# Patient Record
Sex: Male | Born: 1955
Health system: Southern US, Community
[De-identification: ages and names within clinical notes are randomized; demographics above are authoritative.]

## PROBLEM LIST (undated history)

## (undated) ENCOUNTER — Emergency Department (HOSPITAL_COMMUNITY): Admission: EM | Payer: 59 | Source: Home / Self Care

## (undated) DIAGNOSIS — M25559 Pain in unspecified hip: Secondary | ICD-10-CM

## (undated) DIAGNOSIS — S022XXA Fracture of nasal bones, initial encounter for closed fracture: Secondary | ICD-10-CM

## (undated) DIAGNOSIS — I10 Essential (primary) hypertension: Secondary | ICD-10-CM

## (undated) DIAGNOSIS — M545 Low back pain, unspecified: Secondary | ICD-10-CM

## (undated) DIAGNOSIS — F1010 Alcohol abuse, uncomplicated: Secondary | ICD-10-CM

## (undated) DIAGNOSIS — G4730 Sleep apnea, unspecified: Secondary | ICD-10-CM

## (undated) DIAGNOSIS — M722 Plantar fascial fibromatosis: Secondary | ICD-10-CM

## (undated) DIAGNOSIS — R739 Hyperglycemia, unspecified: Secondary | ICD-10-CM

## (undated) DIAGNOSIS — F1910 Other psychoactive substance abuse, uncomplicated: Secondary | ICD-10-CM

## (undated) DIAGNOSIS — Z9989 Dependence on other enabling machines and devices: Secondary | ICD-10-CM

## (undated) DIAGNOSIS — C801 Malignant (primary) neoplasm, unspecified: Secondary | ICD-10-CM

## (undated) DIAGNOSIS — M542 Cervicalgia: Secondary | ICD-10-CM

## (undated) DIAGNOSIS — M1810 Unilateral primary osteoarthritis of first carpometacarpal joint, unspecified hand: Secondary | ICD-10-CM

## (undated) DIAGNOSIS — G4733 Obstructive sleep apnea (adult) (pediatric): Secondary | ICD-10-CM

## (undated) DIAGNOSIS — N419 Inflammatory disease of prostate, unspecified: Secondary | ICD-10-CM

## (undated) DIAGNOSIS — L409 Psoriasis, unspecified: Secondary | ICD-10-CM

## (undated) DIAGNOSIS — E785 Hyperlipidemia, unspecified: Secondary | ICD-10-CM

## (undated) DIAGNOSIS — R079 Chest pain, unspecified: Secondary | ICD-10-CM

## (undated) DIAGNOSIS — F329 Major depressive disorder, single episode, unspecified: Secondary | ICD-10-CM

## (undated) HISTORY — DX: Alcohol abuse, uncomplicated: F10.10

## (undated) HISTORY — DX: Pain in unspecified hip: M25.559

## (undated) HISTORY — DX: Hyperglycemia, unspecified: R73.9

## (undated) HISTORY — DX: Chest pain, unspecified: R07.9

## (undated) HISTORY — PX: PROSTATE BIOPSY: SHX241

## (undated) HISTORY — DX: Cervicalgia: M54.2

## (undated) HISTORY — DX: Other psychoactive substance abuse, uncomplicated: F19.10

## (undated) HISTORY — DX: Unilateral primary osteoarthritis of first carpometacarpal joint, unspecified hand: M18.10

## (undated) HISTORY — DX: Psoriasis, unspecified: L40.9

## (undated) HISTORY — DX: Obstructive sleep apnea (adult) (pediatric): G47.33

## (undated) HISTORY — DX: Inflammatory disease of prostate, unspecified: N41.9

## (undated) HISTORY — DX: Sleep apnea, unspecified: G47.30

## (undated) HISTORY — DX: Plantar fascial fibromatosis: M72.2

## (undated) HISTORY — DX: Hyperlipidemia, unspecified: E78.5

## (undated) HISTORY — DX: Essential (primary) hypertension: I10

## (undated) HISTORY — PX: WISDOM TOOTH EXTRACTION: SHX21

## (undated) HISTORY — DX: Fracture of nasal bones, initial encounter for closed fracture: S02.2XXA

## (undated) HISTORY — DX: Low back pain, unspecified: M54.50

## (undated) HISTORY — DX: Low back pain: M54.5

## (undated) HISTORY — DX: Malignant (primary) neoplasm, unspecified: C80.1

## (undated) HISTORY — DX: Major depressive disorder, single episode, unspecified: F32.9

## (undated) HISTORY — DX: Dependence on other enabling machines and devices: Z99.89

---

## 1976-05-14 HISTORY — PX: NOSE SURGERY: SHX723

## 1978-05-14 DIAGNOSIS — S022XXA Fracture of nasal bones, initial encounter for closed fracture: Secondary | ICD-10-CM

## 1978-05-14 HISTORY — DX: Fracture of nasal bones, initial encounter for closed fracture: S02.2XXA

## 1988-05-14 HISTORY — PX: PROSTATE BIOPSY: SHX241

## 1988-05-14 HISTORY — PX: WISDOM TOOTH EXTRACTION: SHX21

## 2000-05-14 DIAGNOSIS — F1910 Other psychoactive substance abuse, uncomplicated: Secondary | ICD-10-CM

## 2000-05-14 DIAGNOSIS — F1010 Alcohol abuse, uncomplicated: Secondary | ICD-10-CM

## 2000-05-14 HISTORY — DX: Other psychoactive substance abuse, uncomplicated: F19.10

## 2000-05-14 HISTORY — DX: Alcohol abuse, uncomplicated: F10.10

## 2006-12-27 ENCOUNTER — Ambulatory Visit: Payer: Self-pay | Admitting: Gastroenterology

## 2007-01-10 ENCOUNTER — Ambulatory Visit: Payer: Self-pay | Admitting: Gastroenterology

## 2010-01-27 ENCOUNTER — Encounter: Admission: RE | Admit: 2010-01-27 | Discharge: 2010-01-27 | Payer: Self-pay | Admitting: Orthopaedic Surgery

## 2010-06-04 ENCOUNTER — Encounter: Payer: Self-pay | Admitting: Orthopaedic Surgery

## 2012-03-13 ENCOUNTER — Ambulatory Visit (INDEPENDENT_AMBULATORY_CARE_PROVIDER_SITE_OTHER): Payer: BC Managed Care – PPO | Admitting: Emergency Medicine

## 2012-03-13 ENCOUNTER — Ambulatory Visit: Payer: BC Managed Care – PPO

## 2012-03-13 VITALS — BP 138/76 | HR 60 | Temp 97.6°F | Resp 16 | Ht 70.5 in | Wt 212.0 lb

## 2012-03-13 DIAGNOSIS — R0981 Nasal congestion: Secondary | ICD-10-CM

## 2012-03-13 DIAGNOSIS — R05 Cough: Secondary | ICD-10-CM

## 2012-03-13 DIAGNOSIS — J3489 Other specified disorders of nose and nasal sinuses: Secondary | ICD-10-CM

## 2012-03-13 DIAGNOSIS — J40 Bronchitis, not specified as acute or chronic: Secondary | ICD-10-CM

## 2012-03-13 DIAGNOSIS — J45909 Unspecified asthma, uncomplicated: Secondary | ICD-10-CM

## 2012-03-13 LAB — POCT CBC
Granulocyte percent: 65.9 %G (ref 37–80)
MCV: 87.2 fL (ref 80–97)
MID (cbc): 0.5 (ref 0–0.9)
POC LYMPH PERCENT: 27.6 %L (ref 10–50)
Platelet Count, POC: 197 10*3/uL (ref 142–424)
RDW, POC: 15.9 %

## 2012-03-13 LAB — POCT INFLUENZA A/B: Influenza B, POC: NEGATIVE

## 2012-03-13 MED ORDER — PREDNISONE 20 MG PO TABS: ORAL_TABLET | ORAL | Status: DC

## 2012-03-13 MED ORDER — BENZONATATE 200 MG PO CAPS: 200.0000 mg | ORAL_CAPSULE | Freq: Three times a day (TID) | ORAL | Status: DC | PRN

## 2012-03-13 MED ORDER — METHYLPREDNISOLONE ACETATE 80 MG/ML IJ SUSP
40.0000 mg | Freq: Once | INTRAMUSCULAR | Status: AC
  Administered 2012-03-13: 40 mg via INTRAMUSCULAR

## 2012-03-13 MED ORDER — AZITHROMYCIN 250 MG PO TABS: ORAL_TABLET | ORAL | Status: DC

## 2012-03-13 NOTE — Patient Instructions (Signed)
Please recheck in 2-3 days if not feeling better.

## 2012-03-13 NOTE — Progress Notes (Signed)
  Subjective:    Patient ID: Robert Hendrix, male    DOB: [birth year], 56 y.o.   MRN: [id number]  HPI patient enters with 3 day history of head congestion sore throat and generally feeling poorly . He denies having a productive cough. He denies having periodic nasal drainage he has not had any chills with this he has no GI symptoms    Review of Systems patient is a history of a pinched nerve in his neck as well as high cholesterol     Objective:   Physical Exam HEENT exam is unremarkable. Neck is supple. Chest exam reveals occasional rhonchi but no rales and no dullness . Results for orders placed in visit on 03/13/12  POCT CBC      Component Value Range   WBC 8.3  4.6 - 10.2 K/uL   Lymph, poc 2.3  0.6 - 3.4   POC LYMPH PERCENT 27.6  10 - 50 %L   MID (cbc) 0.5  0 - 0.9   POC MID % 6.5  0 - 12 %M   POC Granulocyte 5.5  2 - 6.9   Granulocyte percent 65.9  37 - 80 %G   RBC 5.41  4.69 - 6.13 M/uL   Hemoglobin 14.5  14.1 - 18.1 g/dL   HCT, POC 56.2  13.0 - 53.7 %   MCV 87.2  80 - 97 fL   MCH, POC 26.8 (*) 27 - 31.2 pg   MCHC 30.7 (*) 31.8 - 35.4 g/dL   RDW, POC 86.5     Platelet Count, POC 197  142 - 424 K/uL   MPV 9.2  0 - 99.8 fL  POCT INFLUENZA A/B      Component Value Range   Influenza A, POC Negative     Influenza B, POC Negative      [facility] reading (PRIMARY) by  Dr.Javious Hallisey . Increased basilar markings in both bases consistent with bronchitis       Assessment & Plan:  Patient here with what sounds like an upper respiratory infection with reactive airways disease.

## 2012-06-28 ENCOUNTER — Other Ambulatory Visit: Payer: Self-pay

## 2013-03-19 ENCOUNTER — Other Ambulatory Visit: Payer: Self-pay

## 2015-05-15 DIAGNOSIS — R079 Chest pain, unspecified: Secondary | ICD-10-CM

## 2015-05-15 HISTORY — DX: Chest pain, unspecified: R07.9

## 2015-11-10 ENCOUNTER — Ambulatory Visit: Payer: BLUE CROSS/BLUE SHIELD | Attending: Internal Medicine | Admitting: Physical Therapy

## 2015-11-14 ENCOUNTER — Ambulatory Visit: Payer: BLUE CROSS/BLUE SHIELD | Admitting: Physical Therapy

## 2016-05-14 DIAGNOSIS — I8290 Acute embolism and thrombosis of unspecified vein: Secondary | ICD-10-CM

## 2016-05-14 HISTORY — DX: Acute embolism and thrombosis of unspecified vein: I82.90

## 2016-05-14 HISTORY — PX: COLONOSCOPY: SHX174

## 2016-05-14 HISTORY — PX: POLYPECTOMY: SHX149

## 2016-08-16 DIAGNOSIS — M542 Cervicalgia: Secondary | ICD-10-CM | POA: Diagnosis not present

## 2016-08-16 DIAGNOSIS — M545 Low back pain: Secondary | ICD-10-CM | POA: Diagnosis not present

## 2016-08-16 DIAGNOSIS — G8929 Other chronic pain: Secondary | ICD-10-CM | POA: Diagnosis not present

## 2016-08-17 DIAGNOSIS — M50123 Cervical disc disorder at C6-C7 level with radiculopathy: Secondary | ICD-10-CM | POA: Diagnosis not present

## 2016-08-22 DIAGNOSIS — M50123 Cervical disc disorder at C6-C7 level with radiculopathy: Secondary | ICD-10-CM | POA: Diagnosis not present

## 2016-08-24 DIAGNOSIS — M50123 Cervical disc disorder at C6-C7 level with radiculopathy: Secondary | ICD-10-CM | POA: Diagnosis not present

## 2016-10-05 ENCOUNTER — Encounter: Payer: Self-pay | Admitting: Neurology

## 2016-10-05 ENCOUNTER — Ambulatory Visit (INDEPENDENT_AMBULATORY_CARE_PROVIDER_SITE_OTHER): Payer: 59 | Admitting: Neurology

## 2016-10-05 VITALS — BP 122/72 | HR 74 | Resp 12 | Ht 72.0 in | Wt 230.0 lb

## 2016-10-05 DIAGNOSIS — M545 Low back pain: Secondary | ICD-10-CM | POA: Diagnosis not present

## 2016-10-05 DIAGNOSIS — Z9989 Dependence on other enabling machines and devices: Secondary | ICD-10-CM | POA: Diagnosis not present

## 2016-10-05 DIAGNOSIS — G4733 Obstructive sleep apnea (adult) (pediatric): Secondary | ICD-10-CM | POA: Diagnosis not present

## 2016-10-05 DIAGNOSIS — M50123 Cervical disc disorder at C6-C7 level with radiculopathy: Secondary | ICD-10-CM | POA: Diagnosis not present

## 2016-10-05 DIAGNOSIS — M50122 Cervical disc disorder at C5-C6 level with radiculopathy: Secondary | ICD-10-CM | POA: Diagnosis not present

## 2016-10-05 NOTE — Progress Notes (Signed)
SLEEP MEDICINE CLINIC   Provider:  [name], M D  Primary Care Physician:  Robert Baton, MD   Referring Provider: Shon Baton, MD    Chief Complaint  Patient presents with  . New Evaluation    Rm 11, alone. Sleep consult.    HPI:  Robert Hendrix is a 61 y.o. male , seen here as in a referral/ revisit  from Robert Hendrix for A reevaluation of sleep apnea.  Robert Hendrix was diagnosed with obstructive sleep apnea and placed on a CPAP machine probably around the year 2010. This weight loss he was able to needing less pressures on CPAP and remained on a setting of 10 cm water pressure with 2 cm EPR. Robert Hendrix got remarried last year and his spouse now reports that he still seems to 10 times breathe irregularly at night, but there has been no report of breakthrough snoring. He also does not feel that the quality of his sleep has been different he is not less restored or refreshed in the mornings. He brought me a compliance report today he is 100% compliant with her average user time of 7 hours and 31 minutes, CPAP is set at 10 cm water pressure with 2 cm EPR and the residual AHI is 0.2. The patient had noticed some weight gain probably about 10 pounds over the last year, but he remains physically very active, plays tennis.He is by no means deconditioned and he hasn't changed his medications or lifestyle habits.  Chief complaint according to patient : " My wife noted my irregular breathing "  Sleep habits are as follows: Bedtime is usually between 10:30 and 11 PM, and there is no latency to sleep. He sleeps on his side, he uses a temper. In bed with a very mild raise. Only one pillow. The bedroom is described as cool, quiet and dark. He will usually have one bathroom break at night otherwise sleeps through. He rises in the morning at 6:30 AM. He gets about 7 hours of nocturnal sleep. He reports only hip pain sometimes interfering with his sleep quality, but he does not wake up with  palpitations, diaphoresis, dizziness or headaches.  Occasionally he will take naps in daytime on weekends.  Sleep medical history and family sleep history:  No family history of OSA.  Patient of Robert Hendrix, who  had PVC, normal Echo -  Ef 555, stress test revealed PVCs.  He has been evaluated for hyperglycemia but his hemoglobin A1c is only 5.3, he wore her cardiac monitor in 2017 which revealed only PVCs. He had some occipital muscle tension neck pain. He is a cigar smoker. He is a highly compliant CPAP patient. Medications : currently on Crestor, no longer on simvastatin.  Social history:  Remarried, 3 children. Youngest is 17.  Cigar smoker 1 a day. ETOH none, caffeine : a lot- coffee in AM 2 , soda in PM, no iced tea.    Review of Systems: Out of a complete 14 system review, the patient complains of only the following symptoms, and all other reviewed systems are negative. Epworth score 8 , Fatigue severity score  13  , depression score 1/ 15    Social History   Social History  . Marital status: Married    Spouse name: Robert Hendrix  . Number of children: 3  . Years of education: DMD   Occupational History  . Not on file.   Social History Main Topics  . Smoking status: Light Tobacco Smoker  Types: Cigars  . Smokeless tobacco: Never Used  . Alcohol use No  . Drug use: No  . Sexual activity: Not on file   Other Topics Concern  . Not on file   Social History Narrative   Lives with wife, Robert Hendrix   Caffeine use: 6-8 cups per day   Right handed     Family History  Problem Relation Age of Onset  . Adopted: Yes    Past Medical History:  Diagnosis Date  . Alcohol abuse   . Chest pain   . Drug abuse   . Hyperglycemia   . Hyperlipidemia   . Hypertension   . Low back pain   . Major depression   . Nasal fracture   . Neck pain   . OSA on CPAP   . Osteoarthritis of thumb   . Plantar fasciitis   . Prostatitis   . Psoriasis     Past Surgical History:  Procedure Laterality  Date  . NOSE SURGERY  1978   Nasal fracture   . PROSTATE BIOPSY      Current Outpatient Prescriptions  Medication Sig Dispense Refill  . Multiple Vitamins-Minerals (MULTIVITAMIN ADULTS PO) Take 1 tablet by mouth daily.    . rosuvastatin (CRESTOR) 10 MG tablet Take 10 mg by mouth daily.    Marland Kitchen venlafaxine XR (EFFEXOR-XR) 150 MG 24 hr capsule Take 150 mg by mouth daily with breakfast.     No current facility-administered medications for this visit.     Allergies as of 10/05/2016  . (No Known Allergies)    Vitals: BP 122/72 (BP Location: Right Arm, Patient Position: Sitting, Cuff Size: Normal)   Pulse 74   Resp 12   Ht 6' (1.829 m)   Wt 230 lb (104.3 kg)   SpO2 97%   BMI 31.19 kg/m  Last Weight:  Wt Readings from Last 1 Encounters:  10/05/16 230 lb (104.3 kg)   EVO:JJKK mass index is 31.19 kg/m.     Last Height:   Ht Readings from Last 1 Encounters:  10/05/16 6' (1.829 m)    Physical exam:  General: The patient is awake, alert and appears not in acute distress. The patient is well groomed. Head: Normocephalic, atraumatic. Neck is supple. Mallampati 1,  neck circumference:17. Nasal airflow patent , TMJ click not evident . Retrognathia is not seen.  Bruxism marks- used bite guard.  Cardiovascular:  Regular rate and rhythm, without  murmurs or carotid bruit, and without distended neck veins. Respiratory: Lungs are clear to auscultation. Skin:  Without evidence of edema, or rash Trunk:   Neurologic exam : The patient is awake and alert, oriented to place and time.    Cranial nerves: Pupils are equal and briskly reactive to light.  Extraocular movements  in vertical and horizontal planes intact and without nystagmus. Visual fields by finger perimetry are intact. Hearing to finger rub intact. Facial sensation intact to fine touch.  Facial motor strength is symmetric and tongue and uvula move midline. Shoulder shrug was symmetrical.   Motor exam:   Normal tone, muscle bulk  and symmetric strength in all extremities.     Assessment:  After physical and neurologic examination, review of laboratory studies,  Personal review of imaging studies, reports of other /same  Imaging studies, results of polysomnography and / or neurophysiology testing and pre-existing records as far as provided in visit., my assessment is   1) I have discussed with Robert Hendrix that his download CPAP report reveals no residual  apnea of significance. This could mean that his apnea is perfectly treated it could also mean that his apnea is no longer present. There is certainly no need to adjust CPAP at this point. 2 make sure that there is a baseline apnea but deserves treatment with CPAP, I will order a home sleep test for him that he can use for either one or 2 nights while not using CPAP. The results should help [place] to estimate the best treatment options at this time and if treatment is necessary at all.  The patient was advised of the nature of the diagnosed disorder , the treatment options and the  risks for general health and wellness arising from not treating the condition.   I spent more than 30  minutes of face to face time with the patient.  Greater than 50% of time was spent in counseling and coordination of care. We have discussed the diagnosis and differential and I answered the patient's questions.    Plan:  Treatment plan and additional workup :   HST and follow up as necessary. Will leave on CPAP for now.    [name], MD 8/54/6270, 35:00 AM  Certified in Neurology by ABPN Certified in [place] by [facility] [address] [place], [place] [address]

## 2016-10-22 DIAGNOSIS — G8929 Other chronic pain: Secondary | ICD-10-CM | POA: Diagnosis not present

## 2016-10-22 DIAGNOSIS — M545 Low back pain: Secondary | ICD-10-CM | POA: Diagnosis not present

## 2016-11-01 ENCOUNTER — Encounter: Payer: Self-pay | Admitting: Gastroenterology

## 2016-11-02 DIAGNOSIS — Z0000 Encounter for general adult medical examination without abnormal findings: Secondary | ICD-10-CM | POA: Diagnosis not present

## 2016-11-09 ENCOUNTER — Ambulatory Visit (INDEPENDENT_AMBULATORY_CARE_PROVIDER_SITE_OTHER): Payer: 59 | Admitting: Neurology

## 2016-11-09 DIAGNOSIS — R7309 Other abnormal glucose: Secondary | ICD-10-CM | POA: Diagnosis not present

## 2016-11-09 DIAGNOSIS — Z9989 Dependence on other enabling machines and devices: Principal | ICD-10-CM

## 2016-11-09 DIAGNOSIS — Z125 Encounter for screening for malignant neoplasm of prostate: Secondary | ICD-10-CM | POA: Diagnosis not present

## 2016-11-09 DIAGNOSIS — Z0000 Encounter for general adult medical examination without abnormal findings: Secondary | ICD-10-CM | POA: Diagnosis not present

## 2016-11-09 DIAGNOSIS — M25559 Pain in unspecified hip: Secondary | ICD-10-CM | POA: Diagnosis not present

## 2016-11-09 DIAGNOSIS — G4733 Obstructive sleep apnea (adult) (pediatric): Secondary | ICD-10-CM

## 2016-11-09 DIAGNOSIS — M2550 Pain in unspecified joint: Secondary | ICD-10-CM | POA: Diagnosis not present

## 2016-11-09 DIAGNOSIS — Z1389 Encounter for screening for other disorder: Secondary | ICD-10-CM | POA: Diagnosis not present

## 2016-11-12 DIAGNOSIS — Z1212 Encounter for screening for malignant neoplasm of rectum: Secondary | ICD-10-CM | POA: Diagnosis not present

## 2016-11-19 NOTE — Procedures (Signed)
[facility]  [facility] [address] 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  [name] was diagnosed with obstructive sleep apnea and placed on a CPAP machine probably around the year 2010. With weight loss he was able to need less pressures on CPAP but remained on a setting of 10 cm water pressure with 2 cm EPR. [name] spouse now reports that he still seems to breathe irregularly at night, but there has been no report of breakthrough snoring. He also does not feel that the quality of his sleep has been different -he is not less restored or refreshed in the mornings.  He brought me a compliance report today, which showed that he is 100% compliant with CPAP, at an average user time of 7 hours and 31 minutes-CPAP is set at 10 cm water pressure with 2 cm EPR and the residual AHI is 0.2. Marland Kitchen Epworth score 8 points , Fatigue severity score  13  , depression score 1/ 15.   STUDY RESULTS: Total Recording Time: 7h 70min. Total Apnea/Hypopnea Index (AHI): 8.2/hr. RDI 9.4.Average Oxygen Saturation: 90%. Lowest Oxygen Saturation: SpO2 at  81%, desaturation duration was high - 87 minutes at or below 88% SpO2. Average Heart Rate: 61 bpm, between 46 and 88 bpm. IMPRESSION: Mild OSA , snoring ( reflected in RDI ) but prolonged hypoxemia is present.  RECOMMENDATION: Due to associated low oxygen level, I would still recommend CPAP use. Without this finding he could have used a dental device for snoring and mild apnea correction. I will prescribe an auto-pap CPAP at 5-12 cm water pressure with 2 cm EPR. I recommend an ONO on CPAP to evaluate for oxygen need.  I certify that I have reviewed the raw data recording prior to the issuance of this report  in accordance with the standards of the American Academy of Sleep Medicine (AASM).  [name], MD  11-12-2016  [facility] [place], [place], AASM

## 2016-11-19 NOTE — Addendum Note (Signed)
Addended by: [name] on: 11/19/2016 06:02 PM   Modules accepted: Orders

## 2016-11-20 ENCOUNTER — Encounter: Payer: Self-pay | Admitting: Physical Therapy

## 2016-11-20 ENCOUNTER — Ambulatory Visit: Payer: 59 | Attending: Internal Medicine | Admitting: Physical Therapy

## 2016-11-20 ENCOUNTER — Telehealth: Payer: Self-pay | Admitting: Neurology

## 2016-11-20 DIAGNOSIS — R293 Abnormal posture: Secondary | ICD-10-CM | POA: Insufficient documentation

## 2016-11-20 DIAGNOSIS — M25651 Stiffness of right hip, not elsewhere classified: Secondary | ICD-10-CM | POA: Insufficient documentation

## 2016-11-20 DIAGNOSIS — M25551 Pain in right hip: Secondary | ICD-10-CM | POA: Diagnosis present

## 2016-11-20 DIAGNOSIS — M6281 Muscle weakness (generalized): Secondary | ICD-10-CM | POA: Insufficient documentation

## 2016-11-20 NOTE — Telephone Encounter (Signed)
Called pt to discuss sleep study results. No answer at this time. LVM for pt to call back 

## 2016-11-20 NOTE — Telephone Encounter (Signed)
-----   Message from [name], MD sent at 11/19/2016  6:02 PM EDT ----- Due to associated low oxygen level, I would still  recommend CPAP use. Without this finding he could have used a  dental device for snoring and mild apnea correction. I will  prescribe an auto-pap CPAP at 5-12 cm water pressure with 2 cm  EPR. I recommend an ONO on CPAP to evaluate for oxygen need.   [name], MD

## 2016-11-20 NOTE — Therapy (Signed)
[place], [place], [address] Phone: [phone]   Fax:  [phone]  Physical Therapy Evaluation  Patient Details  Name: Robert Hendrix MRN: [id number] Date of Birth: July 12, 1955 Referring Provider: Shon Baton MD  Encounter Date: 11/20/2016      PT End of Session - 11/20/16 1300    Visit Number 1   Number of Visits 12   Date for PT Re-Evaluation 01/01/17   Authorization Type [employer]   Authorization Time Period 01-01-17   PT Start Time 1146   PT Stop Time 1300   PT Time Calculation (min) 74 min   Activity Tolerance Patient tolerated treatment well   Behavior During Therapy [facility] for tasks assessed/performed      Past Medical History:  Diagnosis Date  . Alcohol abuse   . Chest pain   . Drug abuse   . Hyperglycemia   . Hyperlipidemia   . Hypertension   . Low back pain   . Major depression   . Nasal fracture   . Neck pain   . OSA on CPAP   . Osteoarthritis of thumb   . Plantar fasciitis   . Prostatitis   . Psoriasis     Past Surgical History:  Procedure Laterality Date  . NOSE SURGERY  1978   Nasal fracture   . PROSTATE BIOPSY      There were no vitals filed for this visit.       Subjective Assessment - 11/20/16 1155    Subjective I have had hip pain for about 2 or 3 months    Limitations Other (comment);Standing  Playing tennis   How long can you sit comfortably? unlimited   relieves pain   How long can you stand comfortably? unlimited   How long can you walk comfortably? --  playing tennis  and in the mornings when waking   Diagnostic tests x ray  stenosis   Patient Stated Goals Playing more than one set of tennis without exacerbating pain,    Currently in Pain? Yes   Pain Score 3    Pain Location Hip   Pain Orientation Right;Left  right > Left   Pain Descriptors / Indicators Tightness;Aching   Pain Type Chronic pain   Pain Onset More than a month ago   Pain Frequency  Intermittent   Aggravating Factors  playing tennis, leg lifts , bil leg lifts    Pain Relieving Factors medication, ice after activity            [facility] PT Assessment - 11/20/16 1200      Assessment   Medical Diagnosis bil hip pain   right more severe   Referring Provider Shon Baton MD   Onset Date/Surgical Date 09/20/16  approximately   Hand Dominance Right   Next MD Visit  a year for annual   Prior Therapy saw [facility] for  radiating numbness  L ue   one in June for hip     Precautions   Precautions None     Restrictions   Weight Bearing Restrictions No     Balance Screen   Has the patient fallen in the past 6 months No   Has the patient had a decrease in activity level because of a fear of falling?  No   Is the patient reluctant to leave their home because of a fear of falling?  No     Home Ecologist residence  Home Access Stairs to enter   Entrance Stairs-Number of Steps 3   Entrance Stairs-Rails None   Home Layout One level     Prior Function   Level of Independence Independent   Vocation Full time employment   Vocation Requirements oral surgeon  flexed posture      Cognition   Overall Cognitive Status Within Functional Limits for tasks assessed     Observation/Other Assessments   Focus on Therapeutic Outcomes (FOTO)  FOTO intake 98% limitation 2% predicted  11% (clinical judgement ) 0%      Functional Tests   Functional tests Squat     Squat   Comments minimally wt bears to right. in deep squat     Single Leg Squat   Comments Pt compensates with forward lean and internally rotating on right side     Posture/Postural Control   Posture/Postural Control Postural limitations   Postural Limitations Anterior pelvic tilt   Posture Comments right anterior rotation elevated QL on right shearing force on right.      ROM / Strength   AROM / PROM / Strength AROM;Strength     AROM   Overall AROM  Deficits   Right Hip  External Rotation  45   Right Hip Internal Rotation  20   Left Hip External Rotation  45   Left Hip Internal Rotation  36     Strength   Overall Strength Deficits   Right Hip Flexion 5/5   Right Hip Extension 4+/5   Right Hip ABduction 4/5   Left Hip Flexion 5/5   Left Hip Extension 5/5   Left Hip ABduction 4+/5   Right Knee Flexion 5/5   Right Knee Extension 5/5   Left Knee Flexion 5/5   Left Knee Extension 5/5     Flexibility   Hamstrings Pt with right hamstring tighter than right  right 55,    left 67   Quadriceps Pt with prone quadriceps right with + ELYs greater than left      Palpation   Palpation comment tenderness and tightness over right pirifomis.      Thomas Test    Findings Positive   Comments bil right tighter than left     Ober's Test   Findings Positive   Comments right tighter than left      Hip Scouring   Findings Negative   Comments bil            Objective measurements completed on examination: See above findings.          [facility] Adult PT Treatment/Exercise - 11/20/16 1308      Posture/Postural Control   Posture/Postural Control Postural limitations   Postural Limitations Anterior pelvic tilt   Posture Comments right anterior rotation elevated QL on right shearing force on right.      Self-Care   Self-Care Posture;Other Self-Care Comments   Posture initial sitting and standing posture   Other Self-Care Comments  educated on TDN aftercare and precautians     Lumbar Exercises: Stretches   Passive Hamstring Stretch 30 seconds  sitting.  both VC and TC   Passive Hamstring Stretch Limitations with stretch out strap right side x 3 so sec   Quadruped Mid Back Stretch 30 seconds;4 reps   Quadruped Mid Back Stretch Limitations forward x 2 and to left for right quadratus lumborum stretch   Piriformis Stretch 30 seconds;3 reps  supine   Piriformis Stretch Limitations sitting 30 x sec x 2  Modalities   Modalities Moist Heat      Moist Heat Therapy   Number Minutes Moist Heat 15 Minutes   Moist Heat Location Lumbar Spine;Hip  right hip and right quadratus lumborum     Manual Therapy   Manual Therapy Soft tissue mobilization;Myofascial release   Manual therapy comments trigger point for piriformis and QL   Soft tissue mobilization right piriformis with right hip IR/ER   Myofascial Release right quadratus lumborum          Trigger Point Dry Needling - 11/20/16 1314    Consent Given? Yes   Education Handout Provided Yes   Muscles Treated Upper Body Quadratus Lumborum  right side   Muscles Treated Lower Body Piriformis  right side   Piriformis Response Twitch response elicited;Palpable increased muscle length              PT Education - 11/20/16 1240    Education provided Yes   Education Details POC. Explanation of findings.  intial posture,TDN aftercare and precautians, hamstring stretch, Piriformis and childs pose stretch   Person(s) Educated Patient   Methods Explanation;Demonstration;Tactile cues;Verbal cues;Handout   Comprehension Verbalized understanding;Returned demonstration          PT Short Term Goals - 11/20/16 1300      PT SHORT TERM GOAL #1   Title STG =LTG           PT Long Term Goals - 11/20/16 1300      PT LONG TERM GOAL #1   Title "Pt will be independent with advanced HEP.    Baseline Not knowledgeable about specific exercises for condition.   Time 6   Period Weeks   Status New     PT LONG TERM GOAL #2   Title Pt will be able to play tennis match without exacerbation of pain in right hip   Baseline Pt cannot play one set without pain   Time 6   Period Weeks   Status New     PT LONG TERM GOAL #3   Title Pt will demonstrate symetrical flexiblity of bil hip. for increase comfort upon waking in the morning.  Hip IR right atleast 30 degrees Actively without exacerbating pain   Baseline Pt with positive OBER and thomas test bil bit worse on right   Time 6    Period Weeks   Status New     PT LONG TERM GOAL #4   Title "Demonstrate understanding of proper sitting posture and be more conscious of position and posture throughout the day in order to not deactivate gluteals in standing.   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  Pt stands a good bit of time in his job and can stand unlimted.  Pt notices pain when arising from bed in the morning and with athletic pursuits and loading of hip.  Pt complains of at most 3/10 pain in right hip and hs tightness of pirifomis and elevated quadratus lumborum on right with slight anterior roation  with anterior lean of body in stance.  Pt performs squat with wt shipt to left but able to deep squat just past 90 degrees. Pt has decreased IR of right hip compare to right.  right 20 degrees and left 36 IR. Pt with decreased flexibility and muscle weakness in right hip . SEe flowchart.  Pt would benefit from skilled PT including dry needling for 2 x a week for up to 6 weeks to alleviate symptoms and return to leisure acitivites unlimited by pain.     Clinical Presentation Stable   Clinical Decision Making Low   Rehab Potential Excellent   PT Frequency 2x / week   PT Duration 6 weeks   PT Treatment/Interventions Cryotherapy;Electrical  Stimulation;Iontophoresis 4mg /ml Dexamethasone;Moist Heat;Ultrasound;Traction;Functional mobility training;Therapeutic activities;Therapeutic exercise;Neuromuscular re-education;Patient/family education;Passive range of motion;Manual techniques;Dry needling;Taping   PT Next Visit Plan REview exercises   assess TDN    PT Home Exercise Plan hamstring stretch, piriformis and childs pose   Consulted and Agree with Plan of Care Patient      Patient will benefit from skilled therapeutic intervention in order to improve the following deficits and impairments:  Pain, Postural dysfunction, Decreased mobility, Decreased strength, Hypomobility, Increased fascial restricitons, Impaired flexibility, Decreased activity tolerance (unable to play sports /tennis without pain)  Visit Diagnosis: Pain in right hip  Stiffness of right hip, not elsewhere classified  Muscle weakness (generalized)  Abnormal posture     Problem List There are no active problems to display for this patient.   Voncille Lo, PT Certified Exercise Expert for the Aging Adult  11/20/16 1:49 PM Phone: [phone] Fax: [facility] [address] [place], [place], [address] Phone: [phone]   Fax:  [phone]  Name: Sharron Simpson MRN: [id number] Date of Birth: April 23, 1956

## 2016-11-20 NOTE — Patient Instructions (Addendum)
Get a stretch out strap on [employer]   Posture Tips DO: - stand tall and erect - keep chin tucked in - keep head and shoulders in alignment - check posture regularly in mirror or large window - pull head back against headrest in car seat;  Change your position often.  Sit with lumbar support. DON'T: - slouch or slump while watching TV or reading - sit, stand or lie in one position  for too long;  Sitting is especially hard on the spine so if you sit at a desk/use the computer, then stand up often!   Copyright  VHI. All rights reserved.  Posture - Standing   Good posture is important. Avoid slouching and forward head thrust. Maintain curve in low back and align ears over shoul- ders, hips over ankles.  Pull your belly button in toward your back bone. Even wt in heels and toes, belly button in,  Ribs lifted up ( golden thread to the sky) chin down. Not military and Not titanic position  Copyright  VHI. All rights reserved.  Posture - Sitting   Sit upright, head facing forward. Try using a roll to support lower back. Keep shoulders relaxed, and avoid rounded back. Keep hips level with knees. Avoid crossing legs for long periods.  Sit all the way back in your chair and sit on sit bone not tail bone.   Copyright  VHI. All rights reserved.   Leg Extension (Hamstring)   Sit toward front edge of chair, with leg out straight, heel on floor, toes pointing toward body. Keeping back straight, bend forward at hip, breathing out through pursed lips. Return, breathing in. Repeat _2-3__ times. Hold for 30 seconds Repeat with other leg. Do __2_ sessions per day. Variation: Perform from standing position, with support.      Hamstring Step 3   Left leg in maximal straight leg raise, heel at maximal stretch, straighten knee further by tightening knee cap. Warning: Intense stretch. Stay within tolerance. Hold _30__ seconds. Relax knee cap only.   Hamstring is stetch  Great toe same side  over same  side shoulder ,  IT band is stretch over great toe over opposite shoulder Repeat _2-3 __ times.     Hip Stretch  Put right ankle over left knee. Let right knee fall downward, but keep ankle in place. Feel the stretch in hip. May push down gently with hand to feel stretch. Hold ____ seconds while counting out loud. Repeat with other leg. Repeat _2-3___ times. Do __2-3__ sessions per day.     Stretching: Piriformis (Supine)  Pull right knee toward opposite shoulder. Hold 30____ seconds. Relax. Repeat _2-3___ times per set. Do ___1_ sets per session. Do ___2_ sessions per day.  D Keeping back flat and feet together, rotate knees to left side. Hold ___30_ seconds. Repeat __3_ times per set. . Do __2__ sessions per day.  http://orth.exer.us/122   Copyright  VHI. All rights reserved.    Hamstring Step 2   Left foot relaxed, knee straight, other leg bent, foot flat. Raise straight leg further upward to maximal range. Hold _30__ seconds. Relax leg completely down. Repeat ___ times.  Copyright  VHI. All rights reserved.  Side Twist, Kneeling   Kneel, buttocks on heels. Fold upper body forward from hips. Then reach to each side as far as possible, keeping chest low toward floor. Hold each position _30__ seconds. Repeat __3_ times per session. Do __2_ sessions per day. Lean hands to the left and perform  to stretch right quadratus lumborum  Copyright  VHI. All rights reserved.     Trigger Point Dry Needling  . What is Trigger Point Dry Needling (DN)? o DN is a physical therapy technique used to treat muscle pain and dysfunction. Specifically, DN helps deactivate muscle trigger points (muscle knots).  o A thin filiform needle is used to penetrate the skin and stimulate the underlying trigger point. The goal is for a local twitch response (LTR) to occur and for the trigger point to relax. No medication of any kind is injected during the procedure.   . What Does Trigger Point  Dry Needling Feel Like?  o The procedure feels different for each individual patient. Some patients report that they do not actually feel the needle enter the skin and overall the process is not painful. Very mild bleeding may occur. However, many patients feel a deep cramping in the muscle in which the needle was inserted. This is the local twitch response.   Marland Kitchen How Will I feel after the treatment? o Soreness is normal, and the onset of soreness may not occur for a few hours. Typically this soreness does not last longer than two days.  o Bruising is uncommon, however; ice can be used to decrease any possible bruising.  o In rare cases feeling tired or nauseous after the treatment is normal. In addition, your symptoms may get worse before they get better, this period will typically not last longer than 24 hours.   . What Can I do After My Treatment? o Increase your hydration by drinking more water for the next 24 hours. o You may place ice or heat on the areas treated that have become sore, however, do not use heat on inflamed or bruised areas. Heat often brings more relief post needling. o You can continue your regular activities, but vigorous activity is not recommended initially after the treatment for 24 hours. o DN is best combined with other physical therapy such as strengthening, stretching, and other therapies.   Robert Hendrix, PT Certified Exercise Expert for the Aging Adult  11/20/16 12:47 PM Phone: [phone] Fax: [phone]

## 2016-11-22 ENCOUNTER — Encounter: Payer: Self-pay | Admitting: Gastroenterology

## 2016-11-26 ENCOUNTER — Telehealth: Payer: Self-pay | Admitting: Neurology

## 2016-11-26 DIAGNOSIS — M545 Low back pain: Secondary | ICD-10-CM | POA: Diagnosis not present

## 2016-11-26 DIAGNOSIS — M50123 Cervical disc disorder at C6-C7 level with radiculopathy: Secondary | ICD-10-CM | POA: Diagnosis not present

## 2016-11-26 DIAGNOSIS — G8929 Other chronic pain: Secondary | ICD-10-CM | POA: Diagnosis not present

## 2016-11-26 NOTE — Telephone Encounter (Signed)
Discussed sleep study results with the pt. Pt stated he has been on CPAP and expressed that [facility] was who he gets his supplies from. I told him that Dr Brett Fairy made some changes to his cpap and would like for him to have a ONO done on CPAP to determine if he needs oxygen added. I will send a message to [facility] and see if they can get it changed for the pt. Pt verbalized understanding

## 2016-11-27 ENCOUNTER — Encounter: Payer: Self-pay | Admitting: Physical Therapy

## 2016-11-27 ENCOUNTER — Ambulatory Visit: Payer: 59 | Admitting: Physical Therapy

## 2016-11-27 DIAGNOSIS — M25651 Stiffness of right hip, not elsewhere classified: Secondary | ICD-10-CM

## 2016-11-27 DIAGNOSIS — M25551 Pain in right hip: Secondary | ICD-10-CM | POA: Diagnosis not present

## 2016-11-27 DIAGNOSIS — M6281 Muscle weakness (generalized): Secondary | ICD-10-CM

## 2016-11-27 DIAGNOSIS — R293 Abnormal posture: Secondary | ICD-10-CM

## 2016-11-27 NOTE — Therapy (Signed)
[place], [place], [address] Phone: [phone]   Fax:  [phone]  Physical Therapy Treatment  Patient Details  Name: Robert Hendrix MRN: [id number] Date of Birth: 02-Oct-1955 Referring Provider: Shon Baton MD  Encounter Date: 11/27/2016      PT End of Session - 11/27/16 1153    Visit Number 2   Number of Visits 12   Date for PT Re-Evaluation 01/01/17   Authorization Type BCBS   Authorization Time Period 01-01-17   PT Start Time 1106   PT Stop Time 1200   PT Time Calculation (min) 54 min   Activity Tolerance Patient tolerated treatment well   Behavior During Therapy [employer] for tasks assessed/performed      Past Medical History:  Diagnosis Date  . Alcohol abuse   . Chest pain   . Drug abuse   . Hyperglycemia   . Hyperlipidemia   . Hypertension   . Low back pain   . Major depression   . Nasal fracture   . Neck pain   . OSA on CPAP   . Osteoarthritis of thumb   . Plantar fasciitis   . Prostatitis   . Psoriasis     Past Surgical History:  Procedure Laterality Date  . NOSE SURGERY  1978   Nasal fracture   . PROSTATE BIOPSY      There were no vitals filed for this visit.      Subjective Assessment - 11/27/16 1110    Subjective I did weights on my legs for the first time in a couple of months.  [place]  Sore for about 48 hours.  Epidural injection coming up on July 19th.   Limitations Other (comment);Standing   Diagnostic tests x ray  stenosis   Patient Stated Goals Playing more than one set of tennis without exacerbating pain,    Currently in Pain? Yes   Pain Orientation Right;Left  right is worse than left   Pain Descriptors / Indicators Tightness   Pain Type Chronic pain   Pain Onset More than a month ago                         [facility] Adult PT Treatment/Exercise - 11/27/16 1150      Lumbar Exercises: Stretches   Pelvic Tilt 10 seconds   Pelvic Tilt Limitations with VC for abdominal bracing     Lumbar Exercises: Supine   Ab Set 10 reps   AB Set Limitations 10 sec with VC for abdominal bracing, tested in sidelying as well for better kinesthetic awareness   Clam 10 reps   Clam Limitations VC for abdominal bracingbil   Heel Slides 10 reps   Heel Slides Limitations VC for abdominal bracing   Bent Knee Raise 10 reps   Bent Knee Raise Limitations  VC for abdominal bracing     Lumbar Exercises: Prone   Other Prone Lumbar Exercises attempted prone plank < 20 seconds with poor motor control after 20 seconds     Modalities   Modalities Moist Heat     Moist Heat Therapy   Number Minutes Moist Heat 15 Minutes   Moist Heat Location Lumbar Spine  right hip and right quadratus lumborum     Manual Therapy   Manual Therapy Soft tissue mobilization;Myofascial release   Manual therapy comments trigger point for piriformis and QL   Soft tissue mobilization right piriformis with right hip IR/ER  Myofascial Release right quadratus lumborum                  PT Short Term Goals - 11/20/16 1300      PT SHORT TERM GOAL #1   Title STG =LTG           PT Long Term Goals - 11/20/16 1300      PT LONG TERM GOAL #1   Title "Pt will be independent with advanced HEP.    Baseline Not knowledgeable about specific exercises for condition.   Time 6   Period Weeks   Status New     PT LONG TERM GOAL #2   Title Pt will be able to play tennis match without exacerbation of pain in right hip   Baseline Pt cannot play one set without pain   Time 6   Period Weeks   Status New     PT LONG TERM GOAL #3   Title Pt will demonstrate symetrical flexiblity of bil hip. for increase comfort upon waking in the morning.  Hip IR right atleast 30 degrees Actively without exacerbating pain   Baseline Pt with positive OBER and thomas test bil bit worse on right   Time 6   Period Weeks   Status New     PT LONG TERM GOAL #4   Title  "Demonstrate understanding of proper sitting posture and be more conscious of position and posture throughout the day in order to not deactivate gluteals in standing.   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 Pt with 2/10 pain today and more flexibile Hip ROM today than on eval.  Pt is scheduled for steroid injection on July 19th and is concurrently seeing a chiropractor.  He has multiple exercises and was told to only perform exercises given in PT for now. Prepilates and piriformis and childs pose stretch. Pt  was able to go road biking this weeekend for 8 miles but still has residual soreness.  Pt attempts to perform front plank but has poor motor control. starting at 20 seconds   Rehab Potential Excellent   PT Frequency 2x / week   PT Duration 6 weeks   PT Treatment/Interventions Cryotherapy;Electrical Stimulation;Iontophoresis 4mg /ml Dexamethasone;Moist Heat;Ultrasound;Traction;Functional mobility training;Therapeutic activities;Therapeutic exercise;Neuromuscular re-education;Patient/family education;Passive range of motion;Manual techniques;Dry needling;Taping   PT Next Visit Plan REview exercises  pre pilates and stretches.  Check on results of steroid injection/   PT Home Exercise Plan hamstring stretch, piriformis and childs pose,prepilates   Consulted and Agree with Plan of Care Patient      Patient will benefit  from skilled therapeutic intervention in order to improve the following deficits and impairments:  Pain,  Postural dysfunction, Decreased mobility, Decreased strength, Hypomobility, Increased fascial restricitons, Impaired flexibility, Decreased activity tolerance  Visit Diagnosis: Pain in right hip  Stiffness of right hip, not elsewhere classified  Muscle weakness (generalized)  Abnormal posture     Problem List There are no active problems to display for this patient.  Voncille Lo, PT Certified Exercise Expert for the Aging Adult  11/27/16 12:01 PM Phone: [phone] Fax: [facility] [address] [place], [place], [address] Phone: [phone]   Fax:  [phone]  Name: Robert Hendrix MRN: [id number] Date of Birth: 08-08-1955

## 2016-11-27 NOTE — Patient Instructions (Signed)
   PELVIC TILT  Lie on back, legs bent. Exhale, tilting top of pelvis back, pubic bone up, to flatten lower back. Inhale, rolling pelvis opposite way, top forward, pubic bone down, arch in back. Repeat __10__ times. Do __2__ sessions per day. Copyright  VHI. All rights reserved.    Isometric Hold With Pelvic Floor (Hook-Lying)  Lie with hips and knees bent. Slowly inhale, and then exhale. Pull navel toward spine and tighten pelvic floor. Hold for __10_ seconds. Continue to breathe in and out during hold. Rest for _10__ seconds. Repeat __10_ times. Do __2-3_ times a day.   Knee Fold  Lie on back, legs bent, arms by sides. Exhale, lifting knee to chest. Inhale, returning. Keep abdominals flat, navel to spine. Repeat __10__ times, alternating legs. Do __2__ sessions per day.  Knee Drop  Keep pelvis stable. Without rotating hips, slowly drop knee to side, pause, return to center, bring knee across midline toward opposite hip. Feel obliques engaging. Repeat for ___10_ times each leg.   Copyright  VHI. All rights reserved.       Heel Slide to Straight   Slide one leg down to straight. Return. Be sure pelvis does not rock forward, tilt, rotate, or tip to side. Do _10__ times. Restabilize pelvis. Repeat with other leg. Do __1-2_ sets, __2_ times per day.  http://ss.exer.us/16   Copyright  VHI. All rights reserved.  Voncille Lo, PT Certified Exercise Expert for the Aging Adult  11/27/16 11:31 AM Phone: [phone] Fax: [phone]

## 2016-11-29 ENCOUNTER — Encounter: Payer: BLUE CROSS/BLUE SHIELD | Admitting: Physical Therapy

## 2016-11-29 DIAGNOSIS — M545 Low back pain: Secondary | ICD-10-CM | POA: Diagnosis not present

## 2016-11-30 DIAGNOSIS — G4730 Sleep apnea, unspecified: Secondary | ICD-10-CM | POA: Diagnosis not present

## 2016-11-30 DIAGNOSIS — G4733 Obstructive sleep apnea (adult) (pediatric): Secondary | ICD-10-CM | POA: Diagnosis not present

## 2016-12-03 ENCOUNTER — Telehealth: Payer: Self-pay | Admitting: Neurology

## 2016-12-03 NOTE — Telephone Encounter (Signed)
Called pt to set up his appointment for follow up for insurance requirements with his CPAP. We set apt for Oct 22 at 10:00am

## 2016-12-04 ENCOUNTER — Encounter: Payer: Self-pay | Admitting: Physical Therapy

## 2016-12-04 ENCOUNTER — Ambulatory Visit: Payer: 59 | Admitting: Physical Therapy

## 2016-12-04 DIAGNOSIS — M25651 Stiffness of right hip, not elsewhere classified: Secondary | ICD-10-CM

## 2016-12-04 DIAGNOSIS — M6281 Muscle weakness (generalized): Secondary | ICD-10-CM

## 2016-12-04 DIAGNOSIS — R293 Abnormal posture: Secondary | ICD-10-CM

## 2016-12-04 DIAGNOSIS — M25551 Pain in right hip: Secondary | ICD-10-CM

## 2016-12-04 NOTE — Therapy (Signed)
[place], [place], [address] Phone: [phone]   Fax:  [phone]  Physical Therapy Treatment  Patient Details  Name: Robert Hendrix MRN: [id number] Date of Birth: 11/26/1955 Referring Provider: Shon Baton MD  Encounter Date: 12/04/2016      PT End of Session - 12/04/16 1556    Visit Number 3   Number of Visits 12   Date for PT Re-Evaluation 01/01/17   Authorization Type BCBS   Authorization Time Period 01-01-17   PT Start Time 1505   PT Stop Time 1544   PT Time Calculation (min) 39 min   Activity Tolerance Patient tolerated treatment well   Behavior During Therapy [facility] for tasks assessed/performed      Past Medical History:  Diagnosis Date  . Alcohol abuse   . Chest pain   . Drug abuse   . Hyperglycemia   . Hyperlipidemia   . Hypertension   . Low back pain   . Major depression   . Nasal fracture   . Neck pain   . OSA on CPAP   . Osteoarthritis of thumb   . Plantar fasciitis   . Prostatitis   . Psoriasis     Past Surgical History:  Procedure Laterality Date  . NOSE SURGERY  1978   Nasal fracture   . PROSTATE BIOPSY      There were no vitals filed for this visit.      Subjective Assessment - 12/04/16 1553    Subjective Patient reports only temporary relief from his injections. He reports the needling has improved his symptoms in the mornming. His pain is no longer severe. He continues to have about 2/10 pain after he bikes and plays tennis.    Limitations Other (comment);Standing   How long can you sit comfortably? unlimited   relieves pain   How long can you stand comfortably? unlimited   Diagnostic tests x ray  stenosis   Patient Stated Goals Playing more than one set of tennis without exacerbating pain,    Currently in Pain? Yes   Pain Score 2    Pain Location Hip   Pain Orientation Right   Pain Descriptors / Indicators Tightness   Pain Type Chronic pain   Pain Onset More  than a month ago   Pain Frequency Intermittent   Aggravating Factors  playing tennis, biking    Pain Relieving Factors mwdication and ice after avtivity    Multiple Pain Sites No                         [facility] Adult PT Treatment/Exercise - 12/04/16 0001      Lumbar Exercises: Stretches   Single Knee to Chest Stretch Limitations 2x20sec hold. No pain after hip mobilizations    Piriformis Stretch 30 seconds;3 reps  supine   Piriformis Stretch Limitations sitting 30 x sec x 2      Manual Therapy   Manual Therapy Joint mobilization   Manual therapy comments trigger point for piriformis, QL, and glut medius    Joint Mobilization Prone anterior hip mobilization; Supine posterior hip mobilization; Lower lumbar L3-L5 PA glides. Cavitation noted with just grade II and II PA glides.    Myofascial Release right quadratus lumborum                PT Education - 12/04/16 1556    Education provided Yes   Education Details :POC, explanation of findings, initial posture,  TPDN, aftercare precautions    Person(s) Educated Patient   Methods Explanation;Demonstration;Tactile cues;Verbal cues;Handout   Comprehension Verbalized understanding;Returned demonstration          PT Short Term Goals - 11/20/16 1300      PT SHORT TERM GOAL #1   Title STG =LTG           PT Long Term Goals - 11/20/16 1300      PT LONG TERM GOAL #1   Title "Pt will be independent with advanced HEP.    Baseline Not knowledgeable about specific exercises for condition.   Time 6   Period Weeks   Status New     PT LONG TERM GOAL #2   Title Pt will be able to play tennis match without exacerbation of pain in right hip   Baseline Pt cannot play one set without pain   Time 6   Period Weeks   Status New     PT LONG TERM GOAL #3   Title Pt will demonstrate symetrical flexiblity of bil hip. for increase comfort upon waking in the morning.  Hip IR right atleast 30 degrees Actively without  exacerbating pain   Baseline Pt with positive OBER and thomas test bil bit worse on right   Time 6   Period Weeks   Status New     PT LONG TERM GOAL #4   Title "Demonstrate understanding of proper sitting posture and be more conscious of position and posture throughout the day in order to not deactivate gluteals in standing.   Baseline Pt stands with anterior lean   Time 6   Period Weeks   Status New     PT LONG TERM GOAL #5   Title Pt will be able to perfom strengthening exercises for hip and core without exacerbating hip pain   Baseline was doing bil SLR   Time 6   Period Weeks   Status New     PT LONG TERM GOAL #6   Title Pt will be able to awaken from sleep and perform stretching exercises/strengthening with 1/10 pain or less    Baseline awakens with 3/10 pain daily   Time 6   Period Weeks   Status New               Plan - 12/04/16 1606    Clinical Impression Statement Patient was somewhat reluctant to do the needling so therapy focused on manual therapy to his hip and lumbar spine. He was very open to have needling performed but he had other manual therapy needs. Therapy worked on anterior and posterior hip capsule strengthening as well as lower spinal mobilization. He had a good cavitation with Grade II and II mobilizations. He reported it felt good. He would benefit from further needling of his glutes and quadratus next visit. Patient had anterior hip pain and tightness with hip flexion. After mobilizations he had full pain free range.    Clinical Presentation Stable   Clinical Decision Making Low   Rehab Potential Excellent   PT Frequency 2x / week   PT Duration 8 weeks   PT Treatment/Interventions Cryotherapy;Electrical Stimulation;Iontophoresis 4mg /ml Dexamethasone;Moist Heat;Ultrasound;Traction;Functional mobility training;Therapeutic activities;Therapeutic exercise;Neuromuscular re-education;Patient/family education;Passive range of motion;Manual techniques;Dry  needling;Taping   PT Next Visit Plan REview exercises  pre pilates and stretches.  Check on results of steroid injection/   PT Home Exercise Plan hamstring stretch, piriformis and childs pose,prepilates   Consulted and Agree with Plan of Care Patient  Patient will benefit from skilled therapeutic intervention in order to improve the following deficits and impairments:  Pain, Postural dysfunction, Decreased mobility, Decreased strength, Hypomobility, Increased fascial restricitons, Impaired flexibility, Decreased activity tolerance  Visit Diagnosis: Pain in right hip  Stiffness of right hip, not elsewhere classified  Muscle weakness (generalized)  Abnormal posture     Problem List There are no active problems to display for this patient.   Carney Living PT DPT  12/04/2016, 4:39 PM  [place] [facility] [address] [place], [place], [address] Phone: [phone]   Fax:  [phone]  Name: Stevin Bielinski MRN: [id number] Date of Birth: October 26, 1955

## 2016-12-05 DIAGNOSIS — M4316 Spondylolisthesis, lumbar region: Secondary | ICD-10-CM | POA: Diagnosis not present

## 2016-12-05 DIAGNOSIS — M5416 Radiculopathy, lumbar region: Secondary | ICD-10-CM | POA: Diagnosis not present

## 2016-12-05 DIAGNOSIS — M5412 Radiculopathy, cervical region: Secondary | ICD-10-CM | POA: Diagnosis not present

## 2016-12-05 DIAGNOSIS — M4802 Spinal stenosis, cervical region: Secondary | ICD-10-CM | POA: Diagnosis not present

## 2016-12-06 ENCOUNTER — Encounter: Payer: Self-pay | Admitting: Physical Therapy

## 2016-12-06 ENCOUNTER — Ambulatory Visit: Payer: 59 | Admitting: Physical Therapy

## 2016-12-06 DIAGNOSIS — M25651 Stiffness of right hip, not elsewhere classified: Secondary | ICD-10-CM

## 2016-12-06 DIAGNOSIS — M25551 Pain in right hip: Secondary | ICD-10-CM | POA: Diagnosis not present

## 2016-12-06 DIAGNOSIS — M6281 Muscle weakness (generalized): Secondary | ICD-10-CM

## 2016-12-06 NOTE — Patient Instructions (Addendum)
   Self-Mobilization: Knee Flexion (Prone)    Bring left heel toward buttocks as close as possible. Hold _30 sec___ seconds. Relax. Use strap to assist stretch Repeat _2-3___ times per set. Do __1__ sets per session. Do _1-2___ sessions per day.  http://orth.exer.us/596   Copyright  VHI. All rights reserved.   Voncille Lo, PT Certified Exercise Expert for the Aging Adult  12/06/16 4:02 PM Phone: [phone] Fax: [phone]

## 2016-12-06 NOTE — Therapy (Signed)
[place], [place], [address] Phone: [phone]   Fax:  [phone]  Physical Therapy Treatment  Patient Details  Name: Robert Hendrix MRN: [id number] Date of Birth: 06-25-1955 Referring Provider: Shon Baton MD  Encounter Date: 12/06/2016      PT End of Session - 12/06/16 1551    Visit Number 4   Number of Visits 12   Date for PT Re-Evaluation 01/01/17   Authorization Type BCBS   Authorization Time Period 01-01-17   PT Start Time 1550   PT Stop Time 1645   PT Time Calculation (min) 55 min   Activity Tolerance Patient tolerated treatment well   Behavior During Therapy [facility] for tasks assessed/performed      Past Medical History:  Diagnosis Date  . Alcohol abuse   . Chest pain   . Drug abuse   . Hyperglycemia   . Hyperlipidemia   . Hypertension   . Low back pain   . Major depression   . Nasal fracture   . Neck pain   . OSA on CPAP   . Osteoarthritis of thumb   . Plantar fasciitis   . Prostatitis   . Psoriasis     Past Surgical History:  Procedure Laterality Date  . NOSE SURGERY  1978   Nasal fracture   . PROSTATE BIOPSY      There were no vitals filed for this visit.      Subjective Assessment - 12/06/16 1551    Subjective Pt only had relief from injection for 2 hours.              [facility] PT Assessment - 12/06/16 1705      AROM   Right Hip External Rotation  45   Right Hip Internal Rotation  28                     [facility] Adult PT Treatment/Exercise - 12/06/16 1652      Lumbar Exercises: Stretches   Passive Hamstring Stretch 30 seconds;2 reps   Passive Hamstring Stretch Limitations right with strap,   Single Knee to Chest Stretch Limitations 2x20sec hold. No pain after hip mobilizations and TDN   Piriformis Stretch 30 seconds;3 reps  supine   Piriformis Stretch Limitations sitting 30 x sec x 2      Knee/Hip Exercises: Stretches   Other Knee/Hip Stretches  prone quad and hip flexor stretch 2 x 30 sec with strap bil     Knee/Hip Exercises: Sidelying   Hip ABduction Strengthening;2 sets;15 reps  intially 30 sec hold buttocks against wall   Hip ABduction Limitations 15 x 2 with DF of Right, then foot IR and SLR 15 x 2     Modalities   Modalities Moist Heat     Moist Heat Therapy   Number Minutes Moist Heat 15 Minutes   Moist Heat Location Lumbar Spine;Hip  right  adn right QL     Manual Therapy   Manual therapy comments trigger point for piriformis and QL   Joint Mobilization Prone anterior hip mobilization; Supine posterior hip mobilization; Lower lumbar L3-L5 PA glides.  long arm distraction in supine    Soft tissue mobilization right piriformis with right hip IR/ER   Myofascial Release right quadratus lumborum                  PT Short Term Goals - 11/20/16 1300      PT SHORT TERM GOAL #1  Title STG =LTG           PT Long Term Goals - 12/06/16 1649      PT LONG TERM GOAL #1   Title "Pt will be independent with advanced HEP.    Baseline Pt is independent with intial HEP   Time 6   Period Weeks   Status On-going     PT LONG TERM GOAL #2   Title Pt will be able to play tennis match without exacerbation of pain in right hip   Baseline Pt cannot play one set without pain, but has been mountain biking which he is now stopping due to neck stenosis ( [name])   Time 6   Period Weeks   Status On-going     PT LONG TERM GOAL #3   Title Pt will demonstrate symetrical flexiblity of bil hip. for increase comfort upon waking in the morning.  Hip IR right atleast 30 degrees Actively without exacerbating pain   Baseline Pt with positive OBER and thomas test bil . improved since Eval see flowsheet   Time 6   Period Weeks   Status On-going     PT LONG TERM GOAL #4   Title "Demonstrate understanding of proper sitting posture and be more conscious of position and posture throughout the day in order to not deactivate  gluteals in standing.   Baseline Pt stands with anterior lean of hips,    Time 6   Period Weeks   Status On-going     PT LONG TERM GOAL #5   Title Pt will be able to perfom strengthening exercises for hip and core without exacerbating hip pain   Baseline was doing bil SLR before eval. given Hip abduction exercise, hams stretch, and initial pre pilates exercises and piriformis stretch   Time 6   Period Weeks   Status On-going     PT LONG TERM GOAL #6   Title Pt will be able to awaken from sleep and perform stretching exercises/strengthening with 1/10 pain or less    Baseline awakens with 3-4/10 daily   Time 6   Period Weeks   Status On-going               Plan - 12/06/16 1659    Clinical Impression Statement Pt consented to 3rd TDN therapy today.  Pt with increased right IR of hip post needling. Pt was closely monitored througthout RX .  Pt has been trying to mountain bike but was recently advised by [name] to stop due to left C-5-6  anterior nerve root irriation. [name] has been trying to be compliant with exercises but does sometimes forget to stretch hips.  Pt was advised to try to stretch in the morning when he has the greatest pain of the day  3-4/10.  Pt seems to respond well to manual mobilization.  Will try up to 3 addtional times with TDN and manual to help alleviate tightness and morning. pain.    Rehab Potential Excellent   PT Frequency 2x / week   PT Duration 8 weeks   PT Treatment/Interventions Cryotherapy;Electrical Stimulation;Iontophoresis 4mg /ml Dexamethasone;Moist Heat;Ultrasound;Traction;Functional mobility training;Therapeutic activities;Therapeutic exercise;Neuromuscular re-education;Patient/family education;Passive range of motion;Manual techniques;Dry needling;Taping   PT Next Visit Plan Add to HEP for hip strength. TDN for next 2-3 visits.  assess goals   PT Home Exercise Plan hamstring stretch, piriformis and childs pose,prepilates hip abduction  against wall, hamstring  piriformis and quad.hipflexor prone stretch   Consulted and Agree with Plan  of Care Patient      Patient will benefit from skilled therapeutic intervention in order to improve the following deficits and impairments:  Pain, Postural dysfunction, Decreased mobility, Decreased strength, Hypomobility, Increased fascial restricitons, Impaired flexibility, Decreased activity tolerance  Visit Diagnosis: Pain in right hip  Stiffness of right hip, not elsewhere classified  Muscle weakness (generalized)     Problem List There are no active problems to display for this patient.   Voncille Lo, PT Certified Exercise Expert for the Aging Adult  12/06/16 5:05 PM Phone: [phone] Fax: [facility] [address] [place], [place], [address] Phone: [phone]   Fax:  [phone]  Name: Robert Hendrix MRN: [id number] Date of Birth: [birth year]

## 2016-12-11 ENCOUNTER — Ambulatory Visit: Payer: 59 | Admitting: Physical Therapy

## 2016-12-11 ENCOUNTER — Encounter: Payer: Self-pay | Admitting: Physical Therapy

## 2016-12-11 DIAGNOSIS — M25551 Pain in right hip: Secondary | ICD-10-CM | POA: Diagnosis not present

## 2016-12-11 DIAGNOSIS — M6281 Muscle weakness (generalized): Secondary | ICD-10-CM

## 2016-12-11 DIAGNOSIS — M25651 Stiffness of right hip, not elsewhere classified: Secondary | ICD-10-CM

## 2016-12-11 DIAGNOSIS — R293 Abnormal posture: Secondary | ICD-10-CM

## 2016-12-11 NOTE — Therapy (Signed)
[place], [place], [address] Phone: [phone]   Fax:  [phone]  Physical Therapy Treatment  Patient Details  Name: Nai Dasch MRN: [id number] Date of Birth: [birth year] Referring Provider: Shon Baton MD  Encounter Date: 12/11/2016      PT End of Session - 12/11/16 1546    Visit Number 5   Number of Visits 12   Date for PT Re-Evaluation 01/01/17   Authorization Type BCBS   PT Start Time 1546   PT Stop Time 1628   PT Time Calculation (min) 42 min   Activity Tolerance Patient tolerated treatment well   Behavior During Therapy [facility] for tasks assessed/performed      Past Medical History:  Diagnosis Date  . Alcohol abuse   . Chest pain   . Drug abuse   . Hyperglycemia   . Hyperlipidemia   . Hypertension   . Low back pain   . Major depression   . Nasal fracture   . Neck pain   . OSA on CPAP   . Osteoarthritis of thumb   . Plantar fasciitis   . Prostatitis   . Psoriasis     Past Surgical History:  Procedure Laterality Date  . NOSE SURGERY  1978   Nasal fracture   . PROSTATE BIOPSY      There were no vitals filed for this visit.      Subjective Assessment - 12/11/16 1546    Subjective On/off, mornings are still worst. Row machine creates pain next day.    Currently in Pain? No/denies                         [facility] Adult PT Treatment/Exercise - 12/11/16 0001      Exercises   Exercises Lumbar;Knee/Hip     Lumbar Exercises: Stretches   Lower Trunk Rotation Limitations x3 each   Piriformis Stretch Limitations figure 4 pull in     Lumbar Exercises: Standing   Functional Squats Limitations cues for abdominal wall engagement   Other Standing Lumbar Exercises core engagement in standing     Lumbar Exercises: Supine   AB Set Limitations transverse abdominis engagement   Clam Limitations green band with abdominal engagement   Other Supine Lumbar Exercises bilat  LE to table top, cues for flat back     Knee/Hip Exercises: Sidelying   Clams green tband, cues for form                PT Education - 12/11/16 1722    Education provided Yes   Education Details exercise form/rationale, proximal stability to support distal mobility   Person(s) Educated Patient   Methods Explanation;Demonstration;Tactile cues;Verbal cues;Handout   Comprehension Verbalized understanding;Returned demonstration;Verbal cues required;Tactile cues required;Need further instruction          PT Short Term Goals - 11/20/16 1300      PT SHORT TERM GOAL #1   Title STG =LTG           PT Long Term Goals - 12/06/16 1649      PT LONG TERM GOAL #1   Title "Pt will be independent with advanced HEP.    Baseline Pt is independent with intial HEP   Time 6   Period Weeks   Status On-going     PT LONG TERM GOAL #2   Title Pt will be able to play tennis match without exacerbation of pain in right hip  Baseline Pt cannot play one set without pain, but has been mountain biking which he is now stopping due to neck stenosis ( [name])   Time 6   Period Weeks   Status On-going     PT LONG TERM GOAL #3   Title Pt will demonstrate symetrical flexiblity of bil hip. for increase comfort upon waking in the morning.  Hip IR right atleast 30 degrees Actively without exacerbating pain   Baseline Pt with positive OBER and thomas test bil . improved since Eval see flowsheet   Time 6   Period Weeks   Status On-going     PT LONG TERM GOAL #4   Title "Demonstrate understanding of proper sitting posture and be more conscious of position and posture throughout the day in order to not deactivate gluteals in standing.   Baseline Pt stands with anterior lean of hips,    Time 6   Period Weeks   Status On-going     PT LONG TERM GOAL #5   Title Pt will be able to perfom strengthening exercises for hip and core without exacerbating hip pain   Baseline was doing bil SLR before  eval. given Hip abduction exercise, hams stretch, and initial pre pilates exercises and piriformis stretch   Time 6   Period Weeks   Status On-going     PT LONG TERM GOAL #6   Title Pt will be able to awaken from sleep and perform stretching exercises/strengthening with 1/10 pain or less    Baseline awakens with 3-4/10 daily   Time 6   Period Weeks   Status On-going               Plan - 12/11/16 1717    Clinical Impression Statement Focus today placed on engaging abdominal wall for proximal support to distal mobility. Notable diastasis recti. Difficulty noted engaging transverse abdominis in large movements such as squat and supine leg extensions. Pt verbalized comfort with exercises and felt comfortable utilizing these principles in his other exercises. Quick morning stretch to decrease morning pain- transv abd engagement, LTR, figure 4 piriformis stretch. Pt denied hip pain following treatment.    PT Treatment/Interventions Cryotherapy;Electrical Stimulation;Iontophoresis 4mg /ml Dexamethasone;Moist Heat;Ultrasound;Traction;Functional mobility training;Therapeutic activities;Therapeutic exercise;Neuromuscular re-education;Patient/family education;Passive range of motion;Manual techniques;Dry needling;Taping   PT Next Visit Plan TPDN PRN, how did morning stretch go? was he able to engage core?   PT Home Exercise Plan hamstring stretch, piriformis and childs pose,prepilates hip abduction against wall, hamstring  piriformis and quad.hipflexor prone stretch   Consulted and Agree with Plan of Care Patient      Patient will benefit from skilled therapeutic intervention in order to improve the following deficits and impairments:  Pain, Postural dysfunction, Decreased mobility, Decreased strength, Hypomobility, Increased fascial restricitons, Impaired flexibility, Decreased activity tolerance  Visit Diagnosis: Pain in right hip  Stiffness of right hip, not elsewhere classified  Muscle  weakness (generalized)  Abnormal posture     Problem List There are no active problems to display for this patient.  Remy Dia C. Vallory Oetken PT, DPT 12/11/16 5:28 PM   [facility] [address] [place], [place], [address] Phone: [phone]   Fax:  [phone]  Name: Seanpaul Preece MRN: [id number] Date of Birth: Mar 06, 1956

## 2016-12-18 ENCOUNTER — Encounter: Payer: Self-pay | Admitting: Physical Therapy

## 2016-12-18 ENCOUNTER — Ambulatory Visit: Payer: 59 | Attending: Internal Medicine | Admitting: Physical Therapy

## 2016-12-18 DIAGNOSIS — R293 Abnormal posture: Secondary | ICD-10-CM | POA: Insufficient documentation

## 2016-12-18 DIAGNOSIS — M6281 Muscle weakness (generalized): Secondary | ICD-10-CM | POA: Diagnosis present

## 2016-12-18 DIAGNOSIS — M25551 Pain in right hip: Secondary | ICD-10-CM | POA: Diagnosis not present

## 2016-12-18 DIAGNOSIS — M25651 Stiffness of right hip, not elsewhere classified: Secondary | ICD-10-CM | POA: Diagnosis not present

## 2016-12-18 NOTE — Therapy (Signed)
[facility] [address] [place], [place], [address] Phone: [phone]   Fax:  [phone]  Physical Therapy Treatment  Patient Details  Name: Robert Hendrix MRN: [id number] Date of Birth: 07/24/1955 Referring Provider: Russo, John MD  Encounter Date: 12/18/2016      PT End of Session - 12/18/16 1725    Visit Number 6   Number of Visits 12   Date for PT Re-Evaluation 01/01/17   PT Start Time 1632   PT Stop Time 1718   PT Time Calculation (min) 46 min   Activity Tolerance Patient tolerated treatment well   Behavior During Therapy [facility] for tasks assessed/performed      Past Medical History:  Diagnosis Date  . Alcohol abuse   . Chest pain   . Drug abuse   . Hyperglycemia   . Hyperlipidemia   . Hypertension   . Low back pain   . Major depression   . Nasal fracture   . Neck pain   . OSA on CPAP   . Osteoarthritis of thumb   . Plantar fasciitis   . Prostatitis   . Psoriasis     Past Surgical History:  Procedure Laterality Date  . NOSE SURGERY  1978   Nasal fracture   . PROSTATE BIOPSY      There were no vitals filed for this visit.      Subjective Assessment - 12/18/16 1633    Subjective "off/ on, doing okay my daughter got married"   Currently in Pain? Yes   Pain Score 3    Pain Location Hip   Pain Orientation Right   Pain Descriptors / Indicators Tightness   Pain Type Chronic pain   Pain Onset More than a month ago   Pain Frequency Intermittent   Aggravating Factors  playing tennis, biking   Pain Relieving Factors medication                         [facility] Adult PT Treatment/Exercise - 12/18/16 1725      Knee/Hip Exercises: Stretches   Active Hamstring Stretch 3 reps  contract/ relax with 10 sec hold     Knee/Hip Exercises: Supine   Straight Leg Raises 2 sets;15 reps     Manual Therapy   Manual Therapy Muscle Energy Technique   Joint Mobilization anterior innominate grade 3 mobs  on R   Soft tissue mobilization IASTM over the R lumbar paraspinals   Myofascial Release stretching/ rolling over the R lumbar paraspinals   Muscle Energy Technique prone resisted hip flexion contraction 10 x 10 sec hold, combined with innominate mobs          Trigger Point Dry Needling - 12/18/16 1726    Consent Given? Yes   Education Handout Provided No  given previously   Muscles Treated Upper Body Longissimus   Longissimus Response Twitch response elicited;Palpable increased muscle length  multifidus on R               PT Education - 12/18/16 1658    Education provided Yes   Education Details anatomy of the SIJ and effects of surrounding musculature   Person(s) Educated Patient   Methods Explanation;Verbal cues   Comprehension Verbalized understanding;Verbal cues required          PT Short Term Goals - 11/20/16 1300      PT SHORT TERM GOAL #1   Title STG =LTG             PT Long Term Goals - 12/06/16 1649      PT LONG TERM GOAL #1   Title "Pt will be independent with advanced HEP.    Baseline Pt is independent with intial HEP   Time 6   Period Weeks   Status On-going     PT LONG TERM GOAL #2   Title Pt will be able to play tennis match without exacerbation of pain in right hip   Baseline Pt cannot play one set without pain, but has been mountain biking which he is now stopping due to neck stenosis ( Dr. Stern)   Time 6   Period Weeks   Status On-going     PT LONG TERM GOAL #3   Title Pt will demonstrate symetrical flexiblity of bil hip. for increase comfort upon waking in the morning.  Hip IR right atleast 30 degrees Actively without exacerbating pain   Baseline Pt with positive OBER and thomas test bil . improved since Eval see flowsheet   Time 6   Period Weeks   Status On-going     PT LONG TERM GOAL #4   Title "Demonstrate understanding of proper sitting posture and be more conscious of position and posture throughout the day in order to not  deactivate gluteals in standing.   Baseline Pt stands with anterior lean of hips,    Time 6   Period Weeks   Status On-going     PT LONG TERM GOAL #5   Title Pt will be able to perfom strengthening exercises for hip and core without exacerbating hip pain   Baseline was doing bil SLR before eval. given Hip abduction exercise, hams stretch, and initial pre pilates exercises and piriformis stretch   Time 6   Period Weeks   Status On-going     PT LONG TERM GOAL #6   Title Pt will be able to awaken from sleep and perform stretching exercises/strengthening with 1/10 pain or less    Baseline awakens with 3-4/10 daily   Time 6   Period Weeks   Status On-going               Plan - 12/18/16 1728    Clinical Impression Statement pt reported only 3/10 pain today. pt exhibits possible posteriorly rotated innominate on the R. following stretching and MET he demonstrated improvement in mobility. DN R lumbar paraspinals which he reported improvemen of tightness with trunk flexion. updated HEP for hamstring stretching and SLR. pt declined modalities post session.    PT Next Visit Plan TPDN PRN, posteriorly rotated innominate on R, stretching hamstrings, how did morning stretch go? was he able to engage core?   PT Home Exercise Plan hamstring stretch, piriformis and childs pose,prepilates hip abduction against wall, hamstring  piriformis and quad.hipflexor prone stretch, SLR   Consulted and Agree with Plan of Care Patient      Patient will benefit from skilled therapeutic intervention in order to improve the following deficits and impairments:  Pain, Postural dysfunction, Decreased mobility, Decreased strength, Hypomobility, Increased fascial restricitons, Impaired flexibility, Decreased activity tolerance  Visit Diagnosis: Pain in right hip  Stiffness of right hip, not elsewhere classified  Abnormal posture  Muscle weakness (generalized)     Problem List There are no active  problems to display for this patient.  Robert Hendrix PT, DPT, LAT, ATC  12/18/16  5:31 PM      [facility] [address] [place], [place], [address] Phone: [phone]     Fax:  [phone]  Name: Robert Hendrix MRN: [id number] Date of Birth: 04/10/1956

## 2016-12-20 ENCOUNTER — Ambulatory Visit (AMBULATORY_SURGERY_CENTER): Payer: Self-pay

## 2016-12-20 VITALS — Ht 72.0 in | Wt 228.0 lb

## 2016-12-20 DIAGNOSIS — Z1211 Encounter for screening for malignant neoplasm of colon: Secondary | ICD-10-CM

## 2016-12-20 MED ORDER — SUPREP BOWEL PREP KIT 17.5-3.13-1.6 GM/177ML PO SOLN: 1.0000 | Freq: Once | ORAL | 0 refills | Status: AC

## 2016-12-20 NOTE — Progress Notes (Signed)
MRSA in nose 3 yrs ago, treated and cured  No allergies to eggs or soy No diet med No home oxygen No past problems with anesthesia  Registered emmi

## 2016-12-24 ENCOUNTER — Encounter: Payer: Self-pay | Admitting: Gastroenterology

## 2016-12-25 ENCOUNTER — Ambulatory Visit: Payer: 59 | Admitting: Physical Therapy

## 2016-12-27 ENCOUNTER — Encounter: Payer: Self-pay | Admitting: Physical Therapy

## 2016-12-27 ENCOUNTER — Ambulatory Visit: Payer: 59 | Admitting: Physical Therapy

## 2016-12-27 DIAGNOSIS — M25551 Pain in right hip: Secondary | ICD-10-CM | POA: Diagnosis not present

## 2016-12-27 DIAGNOSIS — R293 Abnormal posture: Secondary | ICD-10-CM

## 2016-12-27 DIAGNOSIS — M25651 Stiffness of right hip, not elsewhere classified: Secondary | ICD-10-CM

## 2016-12-27 DIAGNOSIS — M6281 Muscle weakness (generalized): Secondary | ICD-10-CM

## 2016-12-27 NOTE — Therapy (Signed)
Robert Hendrix, [place], [address] Phone: [phone]   Fax:  [phone]  Physical Therapy Treatment  Patient Details  Name: Robert Hendrix MRN: [id number] Date of Birth: [birth year] Referring Provider: Shon Baton MD  Encounter Date: 12/27/2016      PT End of Session - 12/27/16 1734    Visit Number 7   Number of Visits 12   Date for PT Re-Evaluation 01/01/17   PT Start Time 5300   PT Stop Time 1628   PT Time Calculation (min) 43 min   Activity Tolerance Patient tolerated treatment well   Behavior During Therapy [facility] for tasks assessed/performed      Past Medical History:  Diagnosis Date  . Alcohol abuse   . Chest pain   . Drug abuse   . Hip pain   . Hyperglycemia   . Hyperlipidemia   . Hypertension   . Low back pain   . Major depression   . Nasal fracture   . Neck pain   . OSA on CPAP   . Osteoarthritis of thumb   . Plantar fasciitis   . Prostatitis   . Psoriasis     Past Surgical History:  Procedure Laterality Date  . NOSE SURGERY  1978   Nasal fracture   . PROSTATE BIOPSY      There were no vitals filed for this visit.      Subjective Assessment - 12/27/16 1551    Subjective "I am feeling some soreness after"   Currently in Pain? Yes   Pain Score 6    Pain Orientation Right   Pain Descriptors / Indicators Sore   Pain Type Chronic pain   Pain Onset More than a month ago   Pain Frequency Intermittent   Aggravating Factors  playing tennis, biking   Pain Relieving Factors medication                          [facility] Adult PT Treatment/Exercise - 12/27/16 1607      Lumbar Exercises: Stretches   Active Hamstring Stretch 3 reps;30 seconds  with 10 sec contraction PNF     Lumbar Exercises: Supine   Other Supine Lumbar Exercises dead bug 4 x 20 sec hold, 2 x alternating UE/LE contralateral movement 2 x 10     Knee/Hip Exercises: Aerobic   Elliptical L 3 x 5 min      Manual Therapy   Joint Mobilization anterior innominate grade 3 mobs on R, grade 5 R long axis distraction, and posteriorly rotated    Myofascial Release stretching/ rolling over the R lumbar paraspinals   Muscle Energy Technique prone resisted hip flexion contraction 10 x 10 sec hold, combined with innominate mobs, resisted hip flexion in supine 2 x 10 sec hold, scissor technique using dowel pushing down with LLE and flexing with R (given as HEP)                PT Education - 12/27/16 1734    Education provided Yes   Education Details benefits of good posture to avoid reoccurence of low back pain especially during surgery   Person(s) Educated Patient   Methods Explanation;Verbal cues   Comprehension Verbalized understanding;Verbal cues required          PT Short Term Goals - 11/20/16 1300      PT SHORT TERM GOAL #1   Title STG =LTG  PT Long Term Goals - 12/06/16 1649      PT LONG TERM GOAL #1   Title "Pt will be independent with advanced HEP.    Baseline Pt is independent with intial HEP   Time 6   Period Weeks   Status On-going     PT LONG TERM GOAL #2   Title Pt will be able to play tennis match without exacerbation of pain in right hip   Baseline Pt cannot play one set without pain, but has been mountain biking which he is now stopping due to neck stenosis ( [name])   Time 6   Period Weeks   Status On-going     PT LONG TERM GOAL #3   Title Pt will demonstrate symetrical flexiblity of bil hip. for increase comfort upon waking in the morning.  Hip IR right atleast 30 degrees Actively without exacerbating pain   Baseline Pt with positive OBER and thomas test bil . improved since Eval see flowsheet   Time 6   Period Weeks   Status On-going     PT LONG TERM GOAL #4   Title "Demonstrate understanding of proper sitting posture and be more conscious of position and posture throughout the day in order to not deactivate gluteals in standing.    Baseline Pt stands with anterior lean of hips,    Time 6   Period Weeks   Status On-going     PT LONG TERM GOAL #5   Title Pt will be able to perfom strengthening exercises for hip and core without exacerbating hip pain   Baseline was doing bil SLR before eval. given Hip abduction exercise, hams stretch, and initial pre pilates exercises and piriformis stretch   Time 6   Period Weeks   Status On-going     PT LONG TERM GOAL #6   Title Pt will be able to awaken from sleep and perform stretching exercises/strengthening with 1/10 pain or less    Baseline awakens with 3-4/10 daily   Time 6   Period Weeks   Status On-going               Plan - 12/27/16 1731    Clinical Impression Statement pt arrived today reporting 7/10 pain in the R low back. pt continued to demo R posteriorly rotated innominate. Following MET technique with innominate mobs he reported decreased pain and improvement in pain. continued core strengthening with emphasis on hip flexor activation. post session pt declined modalities and reported pain at 1/10.    PT Next Visit Plan ERO, TPDN PRN, posteriorly rotated innominate on R, stretching hamstrings, core work, posture with standing/ operating, did he get script to add neck to POC?   PT Home Exercise Plan hamstring stretch, piriformis and childs pose,prepilates hip abduction against wall, hamstring  piriformis and quad.hipflexor prone stretch, SLR, self MET, dead bug   Consulted and Agree with Plan of Care Patient      Patient will benefit from skilled therapeutic intervention in order to improve the following deficits and impairments:  Pain, Postural dysfunction, Decreased mobility, Decreased strength, Hypomobility, Increased fascial restricitons, Impaired flexibility, Decreased activity tolerance  Visit Diagnosis: Pain in right hip  Abnormal posture  Muscle weakness (generalized)  Stiffness of right hip, not elsewhere classified     Problem List There  are no active problems to display for this patient.  Starr Lake PT, DPT, LAT, ATC  12/27/16  5:36 PM      [facility]  [place], [place], [address] Phone: [phone]   Fax:  [phone]  Name: Robert Hendrix MRN: [id number] Date of Birth: 1956-04-02

## 2016-12-31 ENCOUNTER — Encounter: Payer: Self-pay | Admitting: Neurology

## 2016-12-31 DIAGNOSIS — G4733 Obstructive sleep apnea (adult) (pediatric): Secondary | ICD-10-CM | POA: Diagnosis not present

## 2016-12-31 DIAGNOSIS — G4730 Sleep apnea, unspecified: Secondary | ICD-10-CM | POA: Diagnosis not present

## 2017-01-04 ENCOUNTER — Ambulatory Visit (AMBULATORY_SURGERY_CENTER): Payer: 59 | Admitting: Gastroenterology

## 2017-01-04 ENCOUNTER — Encounter: Payer: Self-pay | Admitting: Gastroenterology

## 2017-01-04 VITALS — BP 117/75 | HR 55 | Temp 98.2°F | Resp 17 | Ht 72.0 in | Wt 228.0 lb

## 2017-01-04 DIAGNOSIS — Z1211 Encounter for screening for malignant neoplasm of colon: Secondary | ICD-10-CM

## 2017-01-04 DIAGNOSIS — D122 Benign neoplasm of ascending colon: Secondary | ICD-10-CM | POA: Diagnosis not present

## 2017-01-04 DIAGNOSIS — Z1212 Encounter for screening for malignant neoplasm of rectum: Secondary | ICD-10-CM | POA: Diagnosis not present

## 2017-01-04 DIAGNOSIS — D123 Benign neoplasm of transverse colon: Secondary | ICD-10-CM

## 2017-01-04 MED ORDER — SODIUM CHLORIDE 0.9 % IV SOLN: 500.0000 mL | INTRAVENOUS | Status: AC

## 2017-01-04 NOTE — Patient Instructions (Signed)
  No Ibuprofen ,Naproxen, or other non steroidal anti inflammatory products for 2 weeks after polyp removal  Information on polyps,diverticulosis ,and hemorrhoids given to you today  Await pathology results on polyps removed    YOU HAD AN ENDOSCOPIC PROCEDURE TODAY AT [place]:   Refer to the procedure report that was given to you for any specific questions about what was found during the examination.  If the procedure report does not answer your questions, please call your gastroenterologist to clarify.  If you requested that your care partner not be given the details of your procedure findings, then the procedure report has been included in a sealed envelope for you to review at your convenience later.  YOU SHOULD EXPECT: Some feelings of bloating in the abdomen. Passage of more gas than usual.  Walking can help get rid of the air that was put into your GI tract during the procedure and reduce the bloating. If you had a lower endoscopy (such as a colonoscopy or flexible sigmoidoscopy) you may notice spotting of blood in your stool or on the toilet paper. If you underwent a bowel prep for your procedure, you may not have a normal bowel movement for a few days.  Please Note:  You might notice some irritation and congestion in your nose or some drainage.  This is from the oxygen used during your procedure.  There is no need for concern and it should clear up in a day or so.  SYMPTOMS TO REPORT IMMEDIATELY:   Following lower endoscopy (colonoscopy or flexible sigmoidoscopy):  Excessive amounts of blood in the stool  Significant tenderness or worsening of abdominal pains  Swelling of the abdomen that is new, acute  Fever of 100F or higher   For urgent or emergent issues, a gastroenterologist can be reached at any hour by calling [phone].   DIET:  We do recommend a small meal at first, but then you may proceed to your regular diet.  Drink plenty of fluids but you  should avoid alcoholic beverages for 24 hours.  ACTIVITY:  You should plan to take it easy for the rest of today and you should NOT DRIVE or use heavy machinery until tomorrow (because of the sedation medicines used during the test).    FOLLOW UP: Our staff will call the number listed on your records the next business day following your procedure to check on you and address any questions or concerns that you may have regarding the information given to you following your procedure. If we do not reach you, we will leave a message.  However, if you are feeling well and you are not experiencing any problems, there is no need to return our call.  We will assume that you have returned to your regular daily activities without incident.  If any biopsies were taken you will be contacted by phone or by letter within the next 1-3 weeks.  Please call us at [phone] if you have not heard about the biopsies in 3 weeks.    SIGNATURES/CONFIDENTIALITY: You and/or your care partner have signed paperwork which will be entered into your electronic medical record.  These signatures attest to the fact that that the information above on your After Visit Summary has been reviewed and is understood.  Full responsibility of the confidentiality of this discharge information lies with you and/or your care-partner.

## 2017-01-04 NOTE — Progress Notes (Signed)
Called to room to assist during endoscopic procedure.  Patient ID and intended procedure confirmed with present staff. Received instructions for my participation in the procedure from the performing physician.  

## 2017-01-04 NOTE — Progress Notes (Signed)
To PACU, VSS. Report to [email or web address] 

## 2017-01-04 NOTE — Op Note (Signed)
[place] Patient Name: Robert Hendrix Procedure Date: 01/04/2017 3:53 PM MRN: [id number] Endoscopist: Remo Lipps P. Armbruster MD, MD Age: 61 Referring MD:  Date of Birth: 1955/11/15 Gender: Male Account #: [id number] Procedure:                Colonoscopy Indications:              Screening for colorectal malignant neoplasm Medicines:                Monitored Anesthesia Care Procedure:                Pre-Anesthesia Assessment:                           - Prior to the procedure, a History and Physical                            was performed, and patient medications and                            allergies were reviewed. The patient's tolerance of                            previous anesthesia was also reviewed. The risks                            and benefits of the procedure and the sedation                            options and risks were discussed with the patient.                            All questions were answered, and informed consent                            was obtained. Prior Anticoagulants: The patient has                            taken aspirin, last dose was 1 day prior to                            procedure. ASA Grade Assessment: II - A patient                            with mild systemic disease. After reviewing the                            risks and benefits, the patient was deemed in                            satisfactory condition to undergo the procedure.                           After obtaining informed consent, the colonoscope  was passed under direct vision. Throughout the                            procedure, the patient's blood pressure, pulse, and                            oxygen saturations were monitored continuously. The                            Colonoscope was introduced through the anus and                            advanced to the the terminal ileum, with                            identification of the  appendiceal orifice and IC                            valve. The colonoscopy was performed without                            difficulty. The patient tolerated the procedure                            well. The quality of the bowel preparation was                            adequate. The terminal ileum, ileocecal valve,                            appendiceal orifice, and rectum were photographed. Scope In: 4:00:48 PM Scope Out: 4:25:31 PM Scope Withdrawal Time: 0 hours 22 minutes 21 seconds  Total Procedure Duration: 0 hours 24 minutes 43 seconds  Findings:                 The perianal and digital rectal examinations were                            normal.                           The terminal ileum appeared normal.                           Two sessile polyps were found in the ascending                            colon. The polyps were 4 mm in size. These polyps                            were removed with a cold snare. Resection and                            retrieval were complete.  A 6 mm polyp was found in the transverse colon. The                            polyp was flat. The polyp was removed with a cold                            snare. Resection and retrieval were complete.                           A few small-mouthed diverticula were found in the                            sigmoid colon.                           Internal hemorrhoids were found during                            retroflexion. The hemorrhoids were small.                           The bowel was fair on insertion, several minutes                            spent lavaging the colon with adequate prep                            achieved with adequate views. The exam was                            otherwise without abnormality. Complications:            No immediate complications. Estimated blood loss:                            Minimal. Estimated Blood Loss:     Estimated blood loss was  minimal. Impression:               - The examined portion of the ileum was normal.                           - Two 4 mm polyps in the ascending colon, removed                            with a cold snare. Resected and retrieved.                           - One 6 mm polyp in the transverse colon, removed                            with a cold snare. Resected and retrieved.                           - Diverticulosis in the sigmoid colon.                           -  Internal hemorrhoids.                           - The examination was otherwise normal. Recommendation:           - Patient has a contact number available for                            emergencies. The signs and symptoms of potential                            delayed complications were discussed with the                            patient. Return to normal activities tomorrow.                            Written discharge instructions were provided to the                            patient.                           - Resume previous diet.                           - Continue present medications.                           - Await pathology results.                           - Repeat colonoscopy is recommended for                            surveillance. The colonoscopy date will be                            determined after pathology results from today's                            exam become available for review.                           - No ibuprofen, naproxen, or other non-steroidal                            anti-inflammatory drugs for 2 weeks after polyp                            removal. Remo Lipps P. Armbruster MD, MD 01/04/2017 4:30:04 PM This report has been signed electronically.

## 2017-01-07 ENCOUNTER — Telehealth: Payer: Self-pay

## 2017-01-07 NOTE — Telephone Encounter (Signed)
  Follow up Call-  Call back number 01/04/2017  Post procedure Call Back phone  # [phone]  Permission to leave phone message Yes  Some recent data might be hidden     Patient questions:  Do you have a fever, pain , or abdominal swelling? No. Pain Score  0 *  Have you tolerated food without any problems? Yes.    Have you been able to return to your normal activities? Yes.    Do you have any questions about your discharge instructions: Diet   No. Medications  No. Follow up visit  No.  Do you have questions or concerns about your Care? No.  Actions: * If pain score is 4 or above: No action needed, pain <4.  No problems noted per pt. maw

## 2017-01-08 ENCOUNTER — Encounter: Payer: Self-pay | Admitting: Physical Therapy

## 2017-01-08 ENCOUNTER — Ambulatory Visit: Payer: 59 | Admitting: Physical Therapy

## 2017-01-08 DIAGNOSIS — M25551 Pain in right hip: Secondary | ICD-10-CM | POA: Diagnosis not present

## 2017-01-08 DIAGNOSIS — M6281 Muscle weakness (generalized): Secondary | ICD-10-CM

## 2017-01-08 DIAGNOSIS — R293 Abnormal posture: Secondary | ICD-10-CM

## 2017-01-08 DIAGNOSIS — M25651 Stiffness of right hip, not elsewhere classified: Secondary | ICD-10-CM

## 2017-01-08 NOTE — Therapy (Signed)
[place], [place], [address] Phone: [phone]   Fax:  [phone]  Physical Therapy Treatment / Re-certification  Patient Details  Name: Robert Hendrix MRN: [id number] Date of Birth: 1955/10/08 Referring Provider: Shon Baton MD  Encounter Date: 01/08/2017      PT End of Session - 01/08/17 1512    Visit Number 8   Number of Visits 12   Date for PT Re-Evaluation 02/19/17   PT Start Time 1508   PT Stop Time 1547   PT Time Calculation (min) 39 min   Activity Tolerance Patient tolerated treatment well   Behavior During Therapy [facility] for tasks assessed/performed      Past Medical History:  Diagnosis Date  . Alcohol abuse   . Chest pain   . Drug abuse   . Hip pain   . Hyperglycemia   . Hyperlipidemia   . Hypertension   . Low back pain   . Major depression   . Nasal fracture   . Neck pain   . OSA on CPAP   . Osteoarthritis of thumb   . Plantar fasciitis   . Prostatitis   . Psoriasis     Past Surgical History:  Procedure Laterality Date  . NOSE SURGERY  1978   Nasal fracture   . PROSTATE BIOPSY      There were no vitals filed for this visit.      Subjective Assessment - 01/08/17 1508    Subjective "I am doing ok, I played tennis the other day. The colonoscopy and was told not to take NSAIDs which has made more sore"    Currently in Pain? Yes   Pain Score 3    Pain Location Hip   Pain Orientation Right   Pain Type Chronic pain   Pain Onset More than a month ago   Pain Frequency Intermittent   Aggravating Factors  playing tennis, biking   Pain Relieving Factors medication,             [facility] PT Assessment - 01/08/17 1515      Observation/Other Assessments   Focus on Therapeutic Outcomes (FOTO)  FOTO 22% limited     AROM   Right Hip External Rotation  30   Right Hip Internal Rotation  35     Strength   Right Hip Flexion 4+/5   Right Hip Extension 4/5   Right Hip ABduction  4+/5   Left Hip Flexion 4+/5   Left Knee Flexion 5/5   Left Knee Extension 5/5                     [facility] Adult PT Treatment/Exercise - 01/08/17 1553      Therapeutic Activites    Therapeutic Activities Work Simulation   Work [employer] work station with mimicking surgical work. reducing trunk leaning and using LE or proping the foot up to avoid low back pain.      Lumbar Exercises: Stretches   Active Hamstring Stretch 3 reps;30 seconds  contract/ relax with 10 sec hold     Lumbar Exercises: Standing   Forward Lunge 15 reps  x 3 sets performed bil    Forward Lunge Limitations touching down onto Bosu     Manual Therapy   Joint Mobilization anterior innominate grade 3 mobs on R, grade 5 R long axis distraction, and posteriorly rotated    Muscle Energy Technique prone resisted hip flexion contraction 10 x 10 sec hold, combined with  innominate mobs, resisted hip flexion in supine 2 x 10 sec hold, scissor technique using dowel pushing down with LLE and flexing with R (given as HEP)                PT Education - 01/08/17 1618    Education provided Yes   Education Details posture during surgery and avoiding specific psoitions during treatment to prevent pain   Person(s) Educated Patient   Methods Explanation;Verbal cues;Handout   Comprehension Verbalized understanding;Verbal cues required          PT Short Term Goals - 11/20/16 1300      PT SHORT TERM GOAL #1   Title STG =LTG           PT Long Term Goals - 01/08/17 1602      PT LONG TERM GOAL #1   Title "Pt will be independent with advanced HEP.    Time 6   Period Weeks   Status On-going     PT LONG TERM GOAL #2   Title Pt will be able to play tennis match without exacerbation of pain in right hip   Time 6   Period Weeks   Status Partially Met     PT LONG TERM GOAL #3   Title Pt will demonstrate symetrical flexiblity of bil hip. for increase comfort upon waking in the morning.  Hip IR right  atleast 30 degrees Actively without exacerbating pain   Time 6   Period Weeks   Status Partially Met     PT LONG TERM GOAL #4   Title "Demonstrate understanding of proper sitting posture and be more conscious of position and posture throughout the day in order to not deactivate gluteals in standing.   Time 6   Period Weeks   Status On-going     PT LONG TERM GOAL #5   Title Pt will be able to perfom strengthening exercises for hip and core without exacerbating hip pain   Time 6   Period Weeks   Status On-going     PT LONG TERM GOAL #6   Title Pt will be able to awaken from sleep and perform stretching exercises/strengthening with 1/10 pain or less    Time 6   Period Weeks   Status Partially Met               Plan - 01/08/17 1557    Clinical Impression Statement pt continues to demonstrate limited hip IR/ER and intermittent pain in the hip/ low back. pt continues to demo posteriorly rotated innominate on the R with improvement with MET techniques. He is progressing with goals. He would benefit from continued physical therapy to decrease pain, promote proper posture and meet remaining goals.   PT Frequency 2x / week   PT Duration 6 weeks   PT Treatment/Interventions Cryotherapy;Electrical Stimulation;Iontophoresis 4m/ml Dexamethasone;Moist Heat;Ultrasound;Traction;Functional mobility training;Therapeutic activities;Therapeutic exercise;Neuromuscular re-education;Patient/family education;Passive range of motion;Manual techniques;Dry needling;Taping   PT Next Visit Plan posture, work station posture assessment, assess tennis, TPDN PRN, posteriorly rotated innominate on R, stretching hamstrings, core work, posture with standing/ operating, did he get script to add neck to POC?   PT Home Exercise Plan hamstring stretch, piriformis and childs pose,prepilates hip abduction against wall, hamstring  piriformis and quad.hipflexor prone stretch, SLR, self MET, dead bug      Patient will  benefit from skilled therapeutic intervention in order to improve the following deficits and impairments:  Pain, Postural dysfunction, Decreased mobility, Decreased strength, Hypomobility, Increased fascial restricitons,  Impaired flexibility, Decreased activity tolerance  Visit Diagnosis: Pain in right hip - Plan: PT plan of care cert/re-cert  Abnormal posture - Plan: PT plan of care cert/re-cert  Muscle weakness (generalized) - Plan: PT plan of care cert/re-cert  Stiffness of right hip, not elsewhere classified - Plan: PT plan of care cert/re-cert     Problem List There are no active problems to display for this patient.  Starr Lake PT, DPT, LAT, ATC  01/08/17  4:25 PM      [facility] [address] [place], [place], [address] Phone: [phone]   Fax:  [phone]  Name: Robert Hendrix MRN: [id number] Date of Birth: Oct 17, 1955

## 2017-01-09 ENCOUNTER — Encounter: Payer: Self-pay | Admitting: Gastroenterology

## 2017-01-23 DIAGNOSIS — M5412 Radiculopathy, cervical region: Secondary | ICD-10-CM | POA: Diagnosis not present

## 2017-01-23 DIAGNOSIS — M4316 Spondylolisthesis, lumbar region: Secondary | ICD-10-CM | POA: Diagnosis not present

## 2017-01-23 DIAGNOSIS — M4802 Spinal stenosis, cervical region: Secondary | ICD-10-CM | POA: Diagnosis not present

## 2017-01-24 ENCOUNTER — Ambulatory Visit: Payer: 59 | Attending: Internal Medicine | Admitting: Physical Therapy

## 2017-01-24 ENCOUNTER — Ambulatory Visit: Payer: 59 | Admitting: Physical Therapy

## 2017-01-24 ENCOUNTER — Encounter: Payer: Self-pay | Admitting: Physical Therapy

## 2017-01-24 DIAGNOSIS — M25651 Stiffness of right hip, not elsewhere classified: Secondary | ICD-10-CM | POA: Diagnosis not present

## 2017-01-24 DIAGNOSIS — M25551 Pain in right hip: Secondary | ICD-10-CM | POA: Insufficient documentation

## 2017-01-24 DIAGNOSIS — M6281 Muscle weakness (generalized): Secondary | ICD-10-CM | POA: Diagnosis present

## 2017-01-24 DIAGNOSIS — R293 Abnormal posture: Secondary | ICD-10-CM | POA: Diagnosis not present

## 2017-01-24 NOTE — Therapy (Signed)
[place], [place], [address] Phone: [phone]   Fax:  [phone]  Physical Therapy Treatment  Patient Details  Name: Robert Hendrix MRN: [id number] Date of Birth: 01-01-1956 Referring Provider: Shon Baton MD  Encounter Date: 01/24/2017      PT End of Session - 01/24/17 1420    Visit Number 9   Number of Visits 12   Date for PT Re-Evaluation 02/19/17   Authorization Type BCBS   PT Start Time 7341  pt arrived late   PT Stop Time 1501   PT Time Calculation (min) 41 min   Activity Tolerance Patient tolerated treatment well   Behavior During Therapy [facility] for tasks assessed/performed      Past Medical History:  Diagnosis Date  . Alcohol abuse   . Chest pain   . Drug abuse   . Hip pain   . Hyperglycemia   . Hyperlipidemia   . Hypertension   . Low back pain   . Major depression   . Nasal fracture   . Neck pain   . OSA on CPAP   . Osteoarthritis of thumb   . Plantar fasciitis   . Prostatitis   . Psoriasis     Past Surgical History:  Procedure Laterality Date  . NOSE SURGERY  1978   Nasal fracture   . PROSTATE BIOPSY      There were no vitals filed for this visit.      Subjective Assessment - 01/24/17 1420    Subjective Saw [name] yesterday. does not seem to be getting better. Still sore in AM and pain when he first starts playing tennis. pain feels deep into hip.    Currently in Pain? Yes   Pain Score 5   3 on L                         [facility] Adult PT Treatment/Exercise - 01/24/17 0001      Lumbar Exercises: Aerobic   Elliptical 5 min L1 ramp 10     Knee/Hip Exercises: Stretches   Passive Hamstring Stretch Both;2 reps;30 seconds   Passive Hamstring Stretch Limitations seated on chair   Hip Flexor Stretch Limitations thomas test position     Manual Therapy   Joint Mobilization R FA PA at end range ER   Soft tissue mobilization IASTM R hip external rotators       Reformer:  Foot work 2R1B, neutral & turnout Bridging 2R1B static, ball bw knees Sidelying foot in strap 2R1B TT to ext press         PT Education - 01/24/17 1511    Education provided Yes   Education Details discussion held regarding pain and progress   Person(s) Educated Patient   Methods Explanation;Demonstration;Tactile cues;Verbal cues;Handout   Comprehension Verbalized understanding;Returned demonstration;Verbal cues required;Tactile cues required;Need further instruction          PT Short Term Goals - 11/20/16 1300      PT SHORT TERM GOAL #1   Title STG =LTG           PT Long Term Goals - 01/08/17 1602      PT LONG TERM GOAL #1   Title "Pt will be independent with advanced HEP.    Time 6   Period Weeks   Status On-going     PT LONG TERM GOAL #2   Title Pt will be able to play tennis match without exacerbation  of pain in right hip   Time 6   Period Weeks   Status Partially Met     PT LONG TERM GOAL #3   Title Pt will demonstrate symetrical flexiblity of bil hip. for increase comfort upon waking in the morning.  Hip IR right atleast 30 degrees Actively without exacerbating pain   Time 6   Period Weeks   Status Partially Met     PT LONG TERM GOAL #4   Title "Demonstrate understanding of proper sitting posture and be more conscious of position and posture throughout the day in order to not deactivate gluteals in standing.   Time 6   Period Weeks   Status On-going     PT LONG TERM GOAL #5   Title Pt will be able to perfom strengthening exercises for hip and core without exacerbating hip pain   Time 6   Period Weeks   Status On-going     PT LONG TERM GOAL #6   Title Pt will be able to awaken from sleep and perform stretching exercises/strengthening with 1/10 pain or less    Time 6   Period Weeks   Status Partially Met               Plan - 01/24/17 1505    Clinical Impression Statement Utilized reformer today to challenge  lumbopelvic stability. Notable difficulty in controlling IR/ER of bilat LE in press without ball bw knees. Pain in hip noted when he was not engaging abdominal musculature and resolved when engaged. Hip mobilizations to improve ER ROM in R hip.    PT Treatment/Interventions Cryotherapy;Electrical Stimulation;Iontophoresis 19m/ml Dexamethasone;Moist Heat;Ultrasound;Traction;Functional mobility training;Therapeutic activities;Therapeutic exercise;Neuromuscular re-education;Patient/family education;Passive range of motion;Manual techniques;Dry needling;Taping   PT Next Visit Plan posture, work station posture assessment, assess tennis, TPDN PRN, posteriorly rotated innominate on R, stretching hamstrings, core work, posture with standing/ operating, did he get script to add neck to POC?   PT Home Exercise Plan hamstring stretch, piriformis and childs pose,prepilates hip abduction against wall, hamstring  piriformis and quad.hipflexor prone stretch, SLR, self MET, dead bug; sidelying kick with blue tband   Consulted and Agree with Plan of Care Patient      Patient will benefit from skilled therapeutic intervention in order to improve the following deficits and impairments:  Pain, Postural dysfunction, Decreased mobility, Decreased strength, Hypomobility, Increased fascial restricitons, Impaired flexibility, Decreased activity tolerance  Visit Diagnosis: Pain in right hip  Abnormal posture  Stiffness of right hip, not elsewhere classified  Muscle weakness (generalized)     Problem List There are no active problems to display for this patient.   Juanluis Guastella C. Vuong Musa PT, DPT 01/24/17 3:13 PM   [facility] [address]Phone: [phone]  Fax:  [phone] Name: Robert DenkMRN: [id number] of Birth: 412-31-1957

## 2017-01-31 ENCOUNTER — Ambulatory Visit: Payer: 59 | Admitting: Physical Therapy

## 2017-01-31 DIAGNOSIS — G4730 Sleep apnea, unspecified: Secondary | ICD-10-CM | POA: Diagnosis not present

## 2017-01-31 DIAGNOSIS — G4733 Obstructive sleep apnea (adult) (pediatric): Secondary | ICD-10-CM | POA: Diagnosis not present

## 2017-02-05 ENCOUNTER — Telehealth: Payer: Self-pay | Admitting: Neurology

## 2017-02-05 NOTE — Telephone Encounter (Signed)
Called pt back and got him rescheduled to come in Oct 9th at 3:30 pm. Pt stated that he would verify his calendar and if he needed to change he would call us back.

## 2017-02-05 NOTE — Telephone Encounter (Signed)
Pt calling re: CPAP appointment, he has been advised that he needs to be seen on or before 10-19, he is only available on afternoons of Mon & Tues  Due to being in surgery on other days of the week, please call if there is any way of getting him in due to the time frame he has to work with

## 2017-02-07 ENCOUNTER — Ambulatory Visit: Payer: 59 | Admitting: Physical Therapy

## 2017-02-07 DIAGNOSIS — R293 Abnormal posture: Secondary | ICD-10-CM

## 2017-02-07 DIAGNOSIS — M25651 Stiffness of right hip, not elsewhere classified: Secondary | ICD-10-CM

## 2017-02-07 DIAGNOSIS — M6281 Muscle weakness (generalized): Secondary | ICD-10-CM

## 2017-02-07 DIAGNOSIS — M25551 Pain in right hip: Secondary | ICD-10-CM | POA: Diagnosis not present

## 2017-02-07 NOTE — Therapy (Addendum)
[place], [place], [address] Phone: [phone]   Fax:  [phone]  Physical Therapy Treatment/Discharge Note  Patient Details  Name: Robert Hendrix MRN: [id number] Date of Birth: 08/07/1955 Referring Provider: Shon Baton MD  Encounter Date: 02/07/2017      PT End of Session - 02/07/17 1751    Visit Number (P)  10   Number of Visits (P)  12   Date for PT Re-Evaluation (P)  02/19/17   Authorization Type (P)  BCBS   Authorization Time Period (P)  01-01-17   PT Start Time (P)  1148   PT Stop Time (P)  1230   PT Time Calculation (min) (P)  42 min   Activity Tolerance (P)  Patient tolerated treatment well   Behavior During Therapy (P)  WFL for tasks assessed/performed      Past Medical History:  Diagnosis Date  . Alcohol abuse   . Chest pain   . Drug abuse   . Hip pain   . Hyperglycemia   . Hyperlipidemia   . Hypertension   . Low back pain   . Major depression   . Nasal fracture   . Neck pain   . OSA on CPAP   . Osteoarthritis of thumb   . Plantar fasciitis   . Prostatitis   . Psoriasis     Past Surgical History:  Procedure Laterality Date  . NOSE SURGERY  1978   Nasal fracture   . PROSTATE BIOPSY      There were no vitals filed for this visit.      Subjective Assessment - 02/07/17 1748    Subjective Patient does not feel much aof a difference. He is doing 2-3 stretches at home and a clam shell. He feels like he has tons of different exercises and is not sure which ones to do.    Limitations Other (comment);Standing   How long can you sit comfortably? unlimited   relieves pain   How long can you stand comfortably? unlimited   Diagnostic tests x ray  stenosis   Patient Stated Goals Playing more than one set of tennis without exacerbating pain,    Currently in Pain? Yes   Pain Score 5    Pain Location Hip   Pain Orientation Right   Pain Descriptors / Indicators Sore   Pain Onset  More than a month ago   Pain Frequency Intermittent   Aggravating Factors  playing tennis and biking    Pain Relieving Factors medication                          [facility] Adult PT Treatment/Exercise - 02/07/17 0001      Lumbar Exercises: Stretches   Active Hamstring Stretch 3 reps;30 seconds  contract/ relax with 10 sec hold   Piriformis Stretch Limitations 2x30sec      Lumbar Exercises: Supine   Bridge Limitations Bridge series: bridge x10; Bridge with blu band ios 2x10; Bridge Iso with butterfly 2x10; single leg bridge x10 each     Manual Therapy   Joint Mobilization LAD 3x30 sec hold    Soft tissue mobilization IASTM R hip external rotators          Trigger Point Dry Needling - 02/07/17 1747    Consent Given? Yes   Education Handout Provided No   Piriformis Response Twitch response elicited  glut medius twitch respose  PT Education - 02/07/17 1750    Education provided Yes   Education Details importance of improving overall hip stability    Person(s) Educated Patient   Methods Explanation;Demonstration;Tactile cues;Verbal cues   Comprehension Verbalized understanding;Returned demonstration;Verbal cues required;Tactile cues required          PT Short Term Goals - 11/20/16 1300      PT SHORT TERM GOAL #1   Title STG =LTG           PT Long Term Goals - 01/08/17 1602      PT LONG TERM GOAL #1   Title "Pt will be independent with advanced HEP.    Time 6   Period Weeks   Status On-going     PT LONG TERM GOAL #2   Title Pt will be able to play tennis match without exacerbation of pain in right hip   Time 6   Period Weeks   Status Partially Met     PT LONG TERM GOAL #3   Title Pt will demonstrate symetrical flexiblity of bil hip. for increase comfort upon waking in the morning.  Hip IR right atleast 30 degrees Actively without exacerbating pain   Time 6   Period Weeks   Status Partially Met     PT LONG TERM GOAL #4    Title "Demonstrate understanding of proper sitting posture and be more conscious of position and posture throughout the day in order to not deactivate gluteals in standing.   Time 6   Period Weeks   Status On-going     PT LONG TERM GOAL #5   Title Pt will be able to perfom strengthening exercises for hip and core without exacerbating hip pain   Time 6   Period Weeks   Status On-going     PT LONG TERM GOAL #6   Title Pt will be able to awaken from sleep and perform stretching exercises/strengthening with 1/10 pain or less    Time 6   Period Weeks   Status Partially Met               Plan - 02/07/17 2118    Clinical Impression Statement Good twitch respose noted with TPDN. The patient has not made much progress but he is also only doing 1 exercise and 2-3 stretches. He was advised to bring all of his exercises next visit. Therapy will pair them down to 4-5 of the most important exercises to improve his hip stabilization. He will likely work on the exercises on his own and discharge.    Clinical Presentation Stable   Clinical Decision Making Low   Rehab Potential Excellent   PT Frequency 2x / week   PT Duration 6 weeks   PT Treatment/Interventions Cryotherapy;Electrical Stimulation;Iontophoresis 53m/ml Dexamethasone;Moist Heat;Ultrasound;Traction;Functional mobility training;Therapeutic activities;Therapeutic exercise;Neuromuscular re-education;Patient/family education;Passive range of motion;Manual techniques;Dry needling;Taping   PT Next Visit Plan posture, work station posture assessment, assess tennis, TPDN PRN, posteriorly rotated innominate on R, stretching hamstrings, core work, posture with standing/ operating, did he get script to add neck to POC?   PT Home Exercise Plan hamstring stretch, piriformis and childs pose,prepilates hip abduction against wall, hamstring  piriformis and quad.hipflexor prone stretch, SLR, self MET, dead bug; sidelying kick with blue tband    Consulted and Agree with Plan of Care Patient      Patient will benefit from skilled therapeutic intervention in order to improve the following deficits and impairments:  Pain, Postural dysfunction, Decreased mobility, Decreased strength, Hypomobility, Increased fascial  restricitons, Impaired flexibility, Decreased activity tolerance  Visit Diagnosis: Pain in right hip  Abnormal posture  Stiffness of right hip, not elsewhere classified  Muscle weakness (generalized)     Problem List There are no active problems to display for this patient.   Carney Living  PT DPT  02/07/2017, 9:23 PM  [place] [facility] [address] [place], [place], [address] Phone: [phone]   Fax:  [phone]  Name: Robert Hendrix MRN: [id number] Date of Birth: 04/18/1956   PHYSICAL THERAPY DISCHARGE SUMMARY  Visits from Start of Care: 10  Current functional level related to goals / functional outcomes: unknown   Remaining deficits: Unknown, last reported as above   Education / Equipment: HEP and TPDN, active in road biking Plan: Patient agrees to discharge.  Patient goals were partially met. Patient is being discharged due to not returning since the last visit.  ?????    Pt was at 2-3/10 at last visit.  Pt stated he was going to do streamed down HEP and discharge on his own. Pt is very active with cycling and leasure activities.  Voncille Lo, PT Certified Exercise Expert for the Aging Adult  03/21/17 2:20 PM Phone: [phone] Fax: [phone]

## 2017-02-19 ENCOUNTER — Encounter: Payer: Self-pay | Admitting: Neurology

## 2017-02-19 ENCOUNTER — Ambulatory Visit (INDEPENDENT_AMBULATORY_CARE_PROVIDER_SITE_OTHER): Payer: 59 | Admitting: Neurology

## 2017-02-19 VITALS — BP 138/92 | HR 74 | Ht 72.0 in | Wt 232.0 lb

## 2017-02-19 DIAGNOSIS — Z9989 Dependence on other enabling machines and devices: Secondary | ICD-10-CM | POA: Diagnosis not present

## 2017-02-19 DIAGNOSIS — G4733 Obstructive sleep apnea (adult) (pediatric): Secondary | ICD-10-CM

## 2017-02-19 NOTE — Progress Notes (Signed)
SLEEP MEDICINE CLINIC   Provider:  [name], M D  Primary Care Physician:  Shon Baton, MD   Referring Provider: Shon Baton, MD    Chief Complaint  Patient presents with  . Follow-up    pt alone, rm 10. pt states CPAP is working well    HPI: Interval history from 02/19/2017, Robert Hendrix underwent a sleep study in form of a home sleep test on 11/12/2016, this was meant to document if he still has the need to use CPAP. He had very mild obstructive sleep apnea, was snoring and had prolonged hypoxemia associated with his apnea. The AHI was 8.2 which is very mild, desaturation was prolonged at 87 minutes. He was asked to continue with CPAP use. New download : the patient was 100% compliance for the last 30 days, average user time is 7 hours and 34 minutes at night, AutoSet between 5 and 12 cm water with 2 cm EPR, residual AHI is only 1.3, there are no central apneas emerging. No changes in settings are necessary.    Robert Hendrix is a 61 y.o. male , seen here as in a referral/ revisit  from [name] for  reevaluation of sleep apnea.  Robert Hendrix was diagnosed with obstructive sleep apnea and placed on a CPAP machine probably around the year 2010. This weight loss he was able to needing less pressures on CPAP and remained on a setting of 10 cm water pressure with 2 cm EPR. Robert Hendrix got remarried last year and his spouse now reports that he still seems to 10 times breathe irregularly at night, but there has been no report of breakthrough snoring. He also does not feel that the quality of his sleep has been different he is not less restored or refreshed in the mornings. He brought me a compliance report today he is 100% compliant with her average user time of 7 hours and 31 minutes, CPAP is set at 10 cm water pressure with 2 cm EPR and the residual AHI is 0.2. The patient had noticed some weight gain probably about 10 pounds over the last year, but he remains physically very active, plays  tennis.He is by no means deconditioned and he hasn't changed his medications or lifestyle habits.  Chief complaint according to patient : " My wife noted my irregular breathing "  Sleep habits are as follows: Bedtime is usually between 10:30 and 11 PM, and there is no latency to sleep. He sleeps on his side, he uses a temper. In bed with a very mild raise. Only one pillow. The bedroom is described as cool, quiet and dark. He will usually have one bathroom break at night otherwise sleeps through. He rises in the morning at 6:30 AM. He gets about 7 hours of nocturnal sleep. He reports only hip pain sometimes interfering with his sleep quality, but he does not wake up with palpitations, diaphoresis, dizziness or headaches.  Occasionally he will take naps in daytime on weekends.  Sleep medical history and family sleep history:  No family history of OSA.  Patient of [name], who  had PVC, normal Echo - EF 55 %, stress test revealed PVCs.  He has been evaluated for hyperglycemia but his hemoglobin A1c is only 5.3, he wore her cardiac monitor in 2017 which revealed only PVCs. He had some occipital muscle tension neck pain. He is a cigar smoker. He is a highly compliant CPAP patient. Medications : currently on Crestor, no longer on simvastatin.  Social history:  Remarried, 3 children. Youngest is 17.  Cigar smoker 1 a day. ETOH none, caffeine : a lot- coffee in AM 2 , soda in PM, no iced tea.    Review of Systems: Out of a complete 14 system review, the patient complains of only the following symptoms, and all other reviewed systems are negative. Epworth score 8 , Fatigue severity score  13  , depression score 1/ 15    Social History   Social History  . Marital status: Married    Spouse name: Robert Hendrix  . Number of children: 3  . Years of education: DMD   Occupational History  . Not on file.   Social History Main Topics  . Smoking status: Light Tobacco Smoker    Types: Cigars  . Smokeless  tobacco: Never Used  . Alcohol use No  . Drug use: No  . Sexual activity: Not on file   Other Topics Concern  . Not on file   Social History Narrative   Lives with wife, Robert Hendrix   Caffeine use: 6-8 cups per day   Right handed     Family History  Problem Relation Age of Onset  . Adopted: Yes    Past Medical History:  Diagnosis Date  . Alcohol abuse   . Chest pain   . Drug abuse ([place])   . Hip pain   . Hyperglycemia   . Hyperlipidemia   . Hypertension   . Low back pain   . Major depression   . Nasal fracture   . Neck pain   . OSA on CPAP   . Osteoarthritis of thumb   . Plantar fasciitis   . Prostatitis   . Psoriasis     Past Surgical History:  Procedure Laterality Date  . NOSE SURGERY  1978   Nasal fracture   . PROSTATE BIOPSY      Current Outpatient Prescriptions  Medication Sig Dispense Refill  . aspirin EC 81 MG tablet Take 81 mg by mouth daily.    Marland Kitchen ibuprofen (ADVIL,MOTRIN) 800 MG tablet Take 800 mg by mouth 3 (three) times daily.    . meloxicam (MOBIC) 7.5 MG tablet Take 15 mg by mouth as needed.     . Multiple Vitamins-Minerals (MULTIVITAMIN ADULTS PO) Take 1 tablet by mouth daily.    . rosuvastatin (CRESTOR) 10 MG tablet Take 10 mg by mouth daily.    Marland Kitchen venlafaxine XR (EFFEXOR-XR) 150 MG 24 hr capsule Take 150 mg by mouth daily with breakfast.     Current Facility-Administered Medications  Medication Dose Route Frequency Provider Last Rate Last Dose  . 0.9 %  sodium chloride infusion  500 mL Intravenous Continuous Armbruster, Carlota Raspberry, MD        Allergies as of 02/19/2017  . (No Known Allergies)    Vitals: BP (!) 138/92   Pulse 74   Ht 6' (1.829 m)   Wt 232 lb (105.2 kg)   BMI 31.46 kg/m  Last Weight:  Wt Readings from Last 1 Encounters:  02/19/17 232 lb (105.2 kg)   RXV:QMGQ mass index is 31.46 kg/m.     Last Height:   Ht Readings from Last 1 Encounters:  02/19/17 6' (1.829 m)    Physical exam:  General: The patient is awake, alert  and appears not in acute distress. The patient is well groomed. Head: Normocephalic, atraumatic. Neck is supple. Mallampati 1,  neck circumference:17. Nasal airflow patent , TMJ click not evident . Retrognathia is not  seen.  Bruxism marks- used bite guard.  Cardiovascular:  Regular rate and rhythm, without  murmurs or carotid bruit, and without distended neck veins. Respiratory: Lungs are clear to auscultation. Skin:  Without evidence of edema, or rash Trunk:   Neurologic exam : The patient is awake and alert, oriented to place and time.    Cranial nerves: Pupils are equal and briskly reactive to light,  intact.Hearing to finger rub intact. Facial sensation intact to fine touch.Facial motor strength is symmetric and tongue and uvula move midline. Shoulder shrug was symmetrical.   Motor exam:   Normal tone, muscle bulk and symmetric strength in all extremities.     Assessment:  After physical and neurologic examination, review of laboratory studies,  Personal review of imaging studies, reports of other /same  Imaging studies, results of polysomnography and / or neurophysiology testing and pre-existing records as far as provided in visit., my assessment is   1) I have discussed with Robert Hendrix that his download CPAP report reveals no need for change, but hst had confirmed the need for CPAP (mild apnea)  The patient was advised of the nature of the diagnosed disorder , the treatment options and the  risks for general health and wellness arising from not treating the condition.   I spent more than 30  minutes of face to face time with the patient. Greater than 50% of time was spent in counseling and coordination of care. We have discussed the diagnosis and differential and I answered the patient's questions.    Plan:  Treatment plan and additional workup :  Will leave on current  CPAP pressure. Rv in 12 month   [name], MD 34/11/4257, 5:63 PM  Certified in Neurology by  ABPN Certified in [place] by [facility] [address], [place], [place] [address]

## 2017-03-04 ENCOUNTER — Ambulatory Visit: Payer: Self-pay | Admitting: Neurology

## 2017-03-29 DIAGNOSIS — R0789 Other chest pain: Secondary | ICD-10-CM | POA: Diagnosis not present

## 2017-03-29 DIAGNOSIS — J069 Acute upper respiratory infection, unspecified: Secondary | ICD-10-CM | POA: Diagnosis not present

## 2017-03-29 DIAGNOSIS — R0981 Nasal congestion: Secondary | ICD-10-CM | POA: Diagnosis not present

## 2017-04-24 DIAGNOSIS — M5090 Cervical disc disorder, unspecified, unspecified cervical region: Secondary | ICD-10-CM | POA: Diagnosis not present

## 2017-04-24 DIAGNOSIS — M4802 Spinal stenosis, cervical region: Secondary | ICD-10-CM | POA: Diagnosis not present

## 2017-04-24 DIAGNOSIS — M5412 Radiculopathy, cervical region: Secondary | ICD-10-CM | POA: Diagnosis not present

## 2017-05-01 DIAGNOSIS — M5126 Other intervertebral disc displacement, lumbar region: Secondary | ICD-10-CM | POA: Diagnosis not present

## 2017-05-01 DIAGNOSIS — M48061 Spinal stenosis, lumbar region without neurogenic claudication: Secondary | ICD-10-CM | POA: Diagnosis not present

## 2017-05-01 DIAGNOSIS — M4316 Spondylolisthesis, lumbar region: Secondary | ICD-10-CM | POA: Diagnosis not present

## 2017-06-05 DIAGNOSIS — M545 Low back pain: Secondary | ICD-10-CM | POA: Diagnosis not present

## 2017-06-05 DIAGNOSIS — M4316 Spondylolisthesis, lumbar region: Secondary | ICD-10-CM | POA: Diagnosis not present

## 2017-06-05 DIAGNOSIS — M5416 Radiculopathy, lumbar region: Secondary | ICD-10-CM | POA: Diagnosis not present

## 2017-06-10 DIAGNOSIS — G4730 Sleep apnea, unspecified: Secondary | ICD-10-CM | POA: Diagnosis not present

## 2017-06-10 DIAGNOSIS — M4316 Spondylolisthesis, lumbar region: Secondary | ICD-10-CM | POA: Diagnosis not present

## 2017-06-10 DIAGNOSIS — M545 Low back pain: Secondary | ICD-10-CM | POA: Diagnosis not present

## 2017-07-03 DIAGNOSIS — C44612 Basal cell carcinoma of skin of right upper limb, including shoulder: Secondary | ICD-10-CM | POA: Diagnosis not present

## 2017-07-03 DIAGNOSIS — D1801 Hemangioma of skin and subcutaneous tissue: Secondary | ICD-10-CM | POA: Diagnosis not present

## 2017-07-03 DIAGNOSIS — D225 Melanocytic nevi of trunk: Secondary | ICD-10-CM | POA: Diagnosis not present

## 2017-07-03 DIAGNOSIS — L820 Inflamed seborrheic keratosis: Secondary | ICD-10-CM | POA: Diagnosis not present

## 2017-07-03 DIAGNOSIS — L814 Other melanin hyperpigmentation: Secondary | ICD-10-CM | POA: Diagnosis not present

## 2017-07-10 DIAGNOSIS — M5416 Radiculopathy, lumbar region: Secondary | ICD-10-CM | POA: Diagnosis not present

## 2017-07-10 DIAGNOSIS — M4316 Spondylolisthesis, lumbar region: Secondary | ICD-10-CM | POA: Diagnosis not present

## 2017-07-10 DIAGNOSIS — M545 Low back pain: Secondary | ICD-10-CM | POA: Diagnosis not present

## 2017-07-11 ENCOUNTER — Encounter: Payer: Self-pay | Admitting: Physical Therapy

## 2017-07-11 ENCOUNTER — Ambulatory Visit: Payer: 59 | Attending: Neurosurgery | Admitting: Physical Therapy

## 2017-07-11 ENCOUNTER — Other Ambulatory Visit: Payer: Self-pay

## 2017-07-11 DIAGNOSIS — R293 Abnormal posture: Secondary | ICD-10-CM | POA: Diagnosis not present

## 2017-07-11 DIAGNOSIS — M25551 Pain in right hip: Secondary | ICD-10-CM | POA: Diagnosis not present

## 2017-07-12 ENCOUNTER — Encounter: Payer: Self-pay | Admitting: Physical Therapy
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
## 2017-07-20 ENCOUNTER — Other Ambulatory Visit: Payer: Self-pay | Admitting: Physician Assistant

## 2017-07-20 ENCOUNTER — Ambulatory Visit (HOSPITAL_COMMUNITY)
Admission: RE | Admit: 2017-07-20 | Discharge: 2017-07-20 | Disposition: A | Discharge status: Home / Self Care | Payer: 59 | Source: Ambulatory Visit | Attending: Physician Assistant | Admitting: Physician Assistant

## 2017-07-20 DIAGNOSIS — M79652 Pain in left thigh: Secondary | ICD-10-CM | POA: Diagnosis not present

## 2017-07-20 DIAGNOSIS — I82812 Embolism and thrombosis of superficial veins of left lower extremities: Secondary | ICD-10-CM | POA: Diagnosis not present

## 2017-07-20 DIAGNOSIS — M79605 Pain in left leg: Secondary | ICD-10-CM | POA: Diagnosis not present

## 2017-07-20 NOTE — Progress Notes (Signed)
Left lower extremity venous duplex has been completed. Negative for DVT. There is evidence of acute superficial vein thrombosis involving the great saphenous vein of the left lower extremity. Results were given to [facility] PA.  07/20/17 12:54 PM Robert Hendrix RVT

## 2017-07-26 DIAGNOSIS — I809 Phlebitis and thrombophlebitis of unspecified site: Secondary | ICD-10-CM | POA: Diagnosis not present

## 2017-08-14 ENCOUNTER — Other Ambulatory Visit: Payer: Self-pay

## 2017-08-14 DIAGNOSIS — I809 Phlebitis and thrombophlebitis of unspecified site: Secondary | ICD-10-CM

## 2017-08-14 DIAGNOSIS — M79605 Pain in left leg: Secondary | ICD-10-CM

## 2017-08-23 ENCOUNTER — Encounter (INDEPENDENT_AMBULATORY_CARE_PROVIDER_SITE_OTHER): Payer: 59

## 2017-08-26 DIAGNOSIS — I868 Varicose veins of other specified sites: Secondary | ICD-10-CM

## 2017-08-28 ENCOUNTER — Telehealth: Payer: Self-pay | Admitting: Physical Therapy

## 2017-08-28 NOTE — Telephone Encounter (Signed)
Encounter created in error

## 2017-09-09 DIAGNOSIS — M5416 Radiculopathy, lumbar region: Secondary | ICD-10-CM | POA: Diagnosis not present

## 2017-09-21 ENCOUNTER — Emergency Department (HOSPITAL_COMMUNITY): Payer: 59 | Admitting: Certified Registered Nurse Anesthetist

## 2017-09-21 ENCOUNTER — Emergency Department (HOSPITAL_COMMUNITY)
Admission: EM | Admit: 2017-09-21 | Discharge: 2017-09-21 | Disposition: A | Discharge status: Home / Self Care | Payer: 59 | Attending: Emergency Medicine | Admitting: Emergency Medicine

## 2017-09-21 ENCOUNTER — Other Ambulatory Visit: Payer: Self-pay

## 2017-09-21 ENCOUNTER — Encounter (HOSPITAL_COMMUNITY): Payer: Self-pay | Admitting: Emergency Medicine

## 2017-09-21 ENCOUNTER — Encounter (HOSPITAL_COMMUNITY)
Admission: EM | Disposition: A | Discharge status: Home / Self Care | Payer: Self-pay | Source: Home / Self Care | Attending: Emergency Medicine

## 2017-09-21 ENCOUNTER — Emergency Department (HOSPITAL_COMMUNITY): Payer: 59

## 2017-09-21 DIAGNOSIS — Z791 Long term (current) use of non-steroidal anti-inflammatories (NSAID): Secondary | ICD-10-CM | POA: Diagnosis not present

## 2017-09-21 DIAGNOSIS — S51022A Laceration with foreign body of left elbow, initial encounter: Secondary | ICD-10-CM | POA: Diagnosis not present

## 2017-09-21 DIAGNOSIS — S51012A Laceration without foreign body of left elbow, initial encounter: Secondary | ICD-10-CM | POA: Diagnosis not present

## 2017-09-21 DIAGNOSIS — G4733 Obstructive sleep apnea (adult) (pediatric): Secondary | ICD-10-CM | POA: Diagnosis not present

## 2017-09-21 DIAGNOSIS — Y9355 Activity, bike riding: Secondary | ICD-10-CM | POA: Insufficient documentation

## 2017-09-21 DIAGNOSIS — Z79899 Other long term (current) drug therapy: Secondary | ICD-10-CM | POA: Diagnosis not present

## 2017-09-21 DIAGNOSIS — E785 Hyperlipidemia, unspecified: Secondary | ICD-10-CM | POA: Insufficient documentation

## 2017-09-21 DIAGNOSIS — F329 Major depressive disorder, single episode, unspecified: Secondary | ICD-10-CM | POA: Insufficient documentation

## 2017-09-21 DIAGNOSIS — Z86718 Personal history of other venous thrombosis and embolism: Secondary | ICD-10-CM | POA: Insufficient documentation

## 2017-09-21 DIAGNOSIS — Y92482 Bike path as the place of occurrence of the external cause: Secondary | ICD-10-CM | POA: Diagnosis not present

## 2017-09-21 DIAGNOSIS — F17290 Nicotine dependence, other tobacco product, uncomplicated: Secondary | ICD-10-CM | POA: Insufficient documentation

## 2017-09-21 DIAGNOSIS — Z23 Encounter for immunization: Secondary | ICD-10-CM | POA: Diagnosis not present

## 2017-09-21 DIAGNOSIS — Z7901 Long term (current) use of anticoagulants: Secondary | ICD-10-CM | POA: Insufficient documentation

## 2017-09-21 HISTORY — PX: I & D EXTREMITY: SHX5045

## 2017-09-21 LAB — URINALYSIS, ROUTINE W REFLEX MICROSCOPIC
Bilirubin Urine: NEGATIVE
GLUCOSE, UA: NEGATIVE mg/dL
Hgb urine dipstick: NEGATIVE
Ketones, ur: 5 mg/dL — AB
LEUKOCYTES UA: NEGATIVE
Nitrite: NEGATIVE
PH: 5 (ref 5.0–8.0)
Protein, ur: NEGATIVE mg/dL
SPECIFIC GRAVITY, URINE: 1.021 (ref 1.005–1.030)

## 2017-09-21 LAB — CBC WITH DIFFERENTIAL/PLATELET
BASOS ABS: 0 10*3/uL (ref 0.0–0.1)
Basophils Relative: 0 %
Eosinophils Absolute: 0.1 10*3/uL (ref 0.0–0.7)
Eosinophils Relative: 1 %
HEMATOCRIT: 45 % (ref 39.0–52.0)
HEMOGLOBIN: 14.6 g/dL (ref 13.0–17.0)
Lymphocytes Relative: 15 %
Lymphs Abs: 1 10*3/uL (ref 0.7–4.0)
MCH: 28 pg (ref 26.0–34.0)
MCHC: 32.4 g/dL (ref 30.0–36.0)
MCV: 86.2 fL (ref 78.0–100.0)
MONO ABS: 0.4 10*3/uL (ref 0.1–1.0)
Monocytes Relative: 6 %
NEUTROS ABS: 5.1 10*3/uL (ref 1.7–7.7)
NEUTROS PCT: 78 %
Platelets: 161 10*3/uL (ref 150–400)
RBC: 5.22 MIL/uL (ref 4.22–5.81)
RDW: 14.8 % (ref 11.5–15.5)
WBC: 6.6 10*3/uL (ref 4.0–10.5)

## 2017-09-21 LAB — BASIC METABOLIC PANEL
ANION GAP: 11 (ref 5–15)
BUN: 24 mg/dL — ABNORMAL HIGH (ref 6–20)
CO2: 23 mmol/L (ref 22–32)
Calcium: 9.6 mg/dL (ref 8.9–10.3)
Chloride: 104 mmol/L (ref 101–111)
Creatinine, Ser: 1.22 mg/dL (ref 0.61–1.24)
GFR calc Af Amer: >60 mL/min (ref >60)
GLUCOSE: 95 mg/dL (ref 65–99)
POTASSIUM: 4.3 mmol/L (ref 3.5–5.1)
Sodium: 138 mmol/L (ref 135–145)

## 2017-09-21 LAB — MRSA PCR SCREENING: MRSA BY PCR: NEGATIVE

## 2017-09-21 SURGERY — IRRIGATION AND DEBRIDEMENT EXTREMITY
Anesthesia: General | Site: Arm Upper

## 2017-09-21 MED ORDER — LACTATED RINGERS IV SOLN: INTRAVENOUS | Status: DC

## 2017-09-21 MED ORDER — MIDAZOLAM HCL 2 MG/2ML IJ SOLN
INTRAMUSCULAR | Status: AC
  Filled 2017-09-21: qty 2

## 2017-09-21 MED ORDER — DEXAMETHASONE SODIUM PHOSPHATE 10 MG/ML IJ SOLN
INTRAMUSCULAR | Status: DC | PRN
  Administered 2017-09-21: 10 mg via INTRAVENOUS

## 2017-09-21 MED ORDER — CEFAZOLIN SODIUM-DEXTROSE 2-4 GM/100ML-% IV SOLN
2.0000 g | Freq: Once | INTRAVENOUS | Status: AC
  Administered 2017-09-21: 2 g via INTRAVENOUS
  Filled 2017-09-21: qty 100

## 2017-09-21 MED ORDER — PROPOFOL 10 MG/ML IV BOLUS
INTRAVENOUS | Status: DC | PRN
  Administered 2017-09-21: 200 mg via INTRAVENOUS
  Administered 2017-09-21: 50 mg via INTRAVENOUS

## 2017-09-21 MED ORDER — BUPIVACAINE-EPINEPHRINE (PF) 0.5% -1:200000 IJ SOLN
INTRAMUSCULAR | Status: AC
Start: 2017-09-21 — End: ?
  Filled 2017-09-21: qty 30

## 2017-09-21 MED ORDER — FENTANYL CITRATE (PF) 250 MCG/5ML IJ SOLN
INTRAMUSCULAR | Status: AC
  Filled 2017-09-21: qty 5

## 2017-09-21 MED ORDER — SUCCINYLCHOLINE CHLORIDE 200 MG/10ML IV SOSY
PREFILLED_SYRINGE | INTRAVENOUS | Status: DC | PRN
  Administered 2017-09-21: 120 mg via INTRAVENOUS

## 2017-09-21 MED ORDER — ONDANSETRON HCL 4 MG/2ML IJ SOLN
INTRAMUSCULAR | Status: AC
  Filled 2017-09-21: qty 2

## 2017-09-21 MED ORDER — FENTANYL CITRATE (PF) 100 MCG/2ML IJ SOLN
INTRAMUSCULAR | Status: DC | PRN
  Administered 2017-09-21: 100 ug via INTRAVENOUS
  Administered 2017-09-21: 50 ug via INTRAVENOUS

## 2017-09-21 MED ORDER — HYDROMORPHONE HCL 1 MG/ML IJ SOLN: 0.2500 mg | INTRAMUSCULAR | Status: DC | PRN

## 2017-09-21 MED ORDER — MIDAZOLAM HCL 5 MG/5ML IJ SOLN
INTRAMUSCULAR | Status: DC | PRN
  Administered 2017-09-21: 2 mg via INTRAVENOUS

## 2017-09-21 MED ORDER — PROPOFOL 10 MG/ML IV BOLUS
INTRAVENOUS | Status: AC
  Filled 2017-09-21: qty 20

## 2017-09-21 MED ORDER — LIDOCAINE 2% (20 MG/ML) 5 ML SYRINGE
INTRAMUSCULAR | Status: DC | PRN
  Administered 2017-09-21: 100 mg via INTRAVENOUS

## 2017-09-21 MED ORDER — VANCOMYCIN HCL IN DEXTROSE 1-5 GM/200ML-% IV SOLN
INTRAVENOUS | Status: AC
  Filled 2017-09-21: qty 200

## 2017-09-21 MED ORDER — PROMETHAZINE HCL 25 MG/ML IJ SOLN: 6.2500 mg | INTRAMUSCULAR | Status: DC | PRN

## 2017-09-21 MED ORDER — 0.9 % SODIUM CHLORIDE (POUR BTL) OPTIME
TOPICAL | Status: DC | PRN
  Administered 2017-09-21: 1000 mL

## 2017-09-21 MED ORDER — LACTATED RINGERS IV SOLN
INTRAVENOUS | Status: DC | PRN
  Administered 2017-09-21: 14:00:00 via INTRAVENOUS

## 2017-09-21 MED ORDER — LIDOCAINE 2% (20 MG/ML) 5 ML SYRINGE
INTRAMUSCULAR | Status: AC
  Filled 2017-09-21: qty 5

## 2017-09-21 MED ORDER — BUPIVACAINE-EPINEPHRINE 0.5% -1:200000 IJ SOLN
INTRAMUSCULAR | Status: DC | PRN
  Administered 2017-09-21: 20 mL

## 2017-09-21 MED ORDER — DEXAMETHASONE SODIUM PHOSPHATE 10 MG/ML IJ SOLN
INTRAMUSCULAR | Status: AC
  Filled 2017-09-21: qty 1

## 2017-09-21 MED ORDER — VANCOMYCIN HCL IN DEXTROSE 1-5 GM/200ML-% IV SOLN
1000.0000 mg | INTRAVENOUS | Status: AC
  Administered 2017-09-21: 1000 mg via INTRAVENOUS

## 2017-09-21 MED ORDER — KETOROLAC TROMETHAMINE 30 MG/ML IJ SOLN
INTRAMUSCULAR | Status: AC
  Filled 2017-09-21: qty 2

## 2017-09-21 MED ORDER — SUCCINYLCHOLINE CHLORIDE 200 MG/10ML IV SOSY
PREFILLED_SYRINGE | INTRAVENOUS | Status: AC
  Filled 2017-09-21: qty 10

## 2017-09-21 MED ORDER — OXYCODONE HCL 5 MG PO TABS: 5.0000 mg | ORAL_TABLET | Freq: Once | ORAL | Status: DC | PRN

## 2017-09-21 MED ORDER — TETANUS-DIPHTH-ACELL PERTUSSIS 5-2.5-18.5 LF-MCG/0.5 IM SUSP
0.5000 mL | Freq: Once | INTRAMUSCULAR | Status: AC
  Administered 2017-09-21: 0.5 mL via INTRAMUSCULAR
  Filled 2017-09-21: qty 0.5

## 2017-09-21 MED ORDER — MEPERIDINE HCL 50 MG/ML IJ SOLN: 6.2500 mg | INTRAMUSCULAR | Status: DC | PRN

## 2017-09-21 MED ORDER — SODIUM CHLORIDE 0.9 % IR SOLN
Status: DC | PRN
  Administered 2017-09-21: 3000 mL

## 2017-09-21 MED ORDER — CEPHALEXIN 500 MG PO CAPS: 500.0000 mg | ORAL_CAPSULE | Freq: Two times a day (BID) | ORAL | 0 refills | Status: DC

## 2017-09-21 MED ORDER — ONDANSETRON HCL 4 MG/2ML IJ SOLN
INTRAMUSCULAR | Status: DC | PRN
  Administered 2017-09-21: 4 mg via INTRAVENOUS

## 2017-09-21 MED ORDER — OXYCODONE HCL 5 MG/5ML PO SOLN: 5.0000 mg | Freq: Once | ORAL | Status: DC | PRN

## 2017-09-21 MED ORDER — PROPOFOL 10 MG/ML IV BOLUS
INTRAVENOUS | Status: AC
Start: 2017-09-21 — End: ?
  Filled 2017-09-21: qty 20

## 2017-09-21 SURGICAL SUPPLY — 38 items
BANDAGE ACE 4X5 VEL STRL LF (GAUZE/BANDAGES/DRESSINGS) ×1 IMPLANT
BANDAGE ESMARK 6X9 LF (GAUZE/BANDAGES/DRESSINGS) IMPLANT
BNDG CMPR 9X6 STRL LF SNTH (GAUZE/BANDAGES/DRESSINGS) ×1
BNDG ESMARK 6X9 LF (GAUZE/BANDAGES/DRESSINGS) ×2
BNDG GAUZE ELAST 4 BULKY (GAUZE/BANDAGES/DRESSINGS) IMPLANT
COVER SURGICAL LIGHT HANDLE (MISCELLANEOUS) ×2 IMPLANT
CUFF TOURN SGL QUICK 18 (TOURNIQUET CUFF) ×1 IMPLANT
CUFF TOURN SGL QUICK 24 (TOURNIQUET CUFF)
CUFF TOURN SGL QUICK 34 (TOURNIQUET CUFF)
CUFF TRNQT CYL 24X4X40X1 (TOURNIQUET CUFF) IMPLANT
CUFF TRNQT CYL 34X4X40X1 (TOURNIQUET CUFF) IMPLANT
DRAIN PENROSE 18X1/2 LTX STRL (DRAIN) IMPLANT
DRAPE U-SHAPE 47X51 STRL (DRAPES) ×2 IMPLANT
DRSG PAD ABDOMINAL 8X10 ST (GAUZE/BANDAGES/DRESSINGS) ×4 IMPLANT
DURAPREP 26ML APPLICATOR (WOUND CARE) ×2 IMPLANT
ELECT REM PT RETURN 15FT ADLT (MISCELLANEOUS) ×2 IMPLANT
EVACUATOR 1/8 PVC DRAIN (DRAIN) IMPLANT
GAUZE SPONGE 4X4 12PLY STRL (GAUZE/BANDAGES/DRESSINGS) ×1 IMPLANT
GAUZE XEROFORM 5X9 LF (GAUZE/BANDAGES/DRESSINGS) ×1 IMPLANT
GLOVE BIOGEL PI IND STRL 8 (GLOVE) ×1 IMPLANT
GLOVE BIOGEL PI INDICATOR 8 (GLOVE) ×1
GLOVE ORTHO TXT STRL SZ7.5 (GLOVE) ×2 IMPLANT
GOWN STRL REUS W/TWL LRG LVL3 (GOWN DISPOSABLE) ×2 IMPLANT
GOWN STRL REUS W/TWL XL LVL3 (GOWN DISPOSABLE) ×2 IMPLANT
HANDPIECE INTERPULSE COAX TIP (DISPOSABLE) ×2
KIT BASIN OR (CUSTOM PROCEDURE TRAY) ×2 IMPLANT
PACK ORTHO EXTREMITY (CUSTOM PROCEDURE TRAY) ×2 IMPLANT
PAD CAST 4YDX4 CTTN HI CHSV (CAST SUPPLIES) IMPLANT
PADDING CAST COTTON 4X4 STRL (CAST SUPPLIES) ×2
PADDING CAST COTTON 6X4 STRL (CAST SUPPLIES) ×1 IMPLANT
POSITIONER SURGICAL ARM (MISCELLANEOUS) IMPLANT
SET HNDPC FAN SPRY TIP SCT (DISPOSABLE) IMPLANT
SPONGE LAP 18X18 RF (DISPOSABLE) IMPLANT
SUT ETHILON 2 0 PS N (SUTURE) IMPLANT
SUT ETHILON 2 0 PSLX (SUTURE) ×3 IMPLANT
SYR CONTROL 10ML LL (SYRINGE) ×1 IMPLANT
TOWEL OR 17X26 10 PK STRL BLUE (TOWEL DISPOSABLE) ×2 IMPLANT
TOWEL OR NON WOVEN STRL DISP B (DISPOSABLE) ×2 IMPLANT
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
## 2017-09-21 NOTE — ED Notes (Signed)
Transported to OR via stretcher
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
## 2017-09-21 NOTE — Transfer of Care (Signed)
Immediate Anesthesia Transfer of Care Note  Patient: Robert Hendrix  Procedure(s) Performed: IRRIGATION AND DEBRIDEMENT EXTREMITY (N/A Arm Upper)  Patient Location: PACU  Anesthesia Type:General  Level of Consciousness: drowsy and patient cooperative  Airway & Oxygen Therapy: Patient Spontanous Breathing and Patient connected to face mask oxygen  Post-op Assessment: Report given to RN and Post -op Vital signs reviewed and stable  Post vital signs: Reviewed and stable  Last Vitals:  Vitals Value Taken Time  BP 150/81 09/21/2017  3:24 PM  Temp    Pulse 79 09/21/2017  3:26 PM  Resp 17 09/21/2017  3:26 PM  SpO2 100 % 09/21/2017  3:26 PM  Vitals shown include unvalidated device data.  Last Pain:  Vitals:   09/21/17 1130  TempSrc:   PainSc: 3          Complications: No apparent anesthesia complications

## 2017-09-21 NOTE — ED Notes (Signed)
Dr [name] at bedside. OR called for pt

## 2017-09-21 NOTE — Progress Notes (Signed)
Siting up on stretcher sipping cole and tolerating well, continues to deny pain

## 2017-09-21 NOTE — Anesthesia Procedure Notes (Signed)
Procedure Name: Intubation Date/Time: 09/21/2017 2:08 PM Performed by: Montel Clock, CRNA Pre-anesthesia Checklist: Patient identified, Emergency Drugs available, Suction available, Patient being monitored and Timeout performed Patient Re-evaluated:Patient Re-evaluated prior to induction Oxygen Delivery Method: Circle system utilized Preoxygenation: Pre-oxygenation with 100% oxygen Induction Type: IV induction, Rapid sequence and Cricoid Pressure applied Laryngoscope Size: Mac and 3 Grade View: Grade I Tube type: Oral Tube size: 7.5 mm Number of attempts: 1 Airway Equipment and Method: Stylet Placement Confirmation: ETT inserted through vocal cords under direct vision,  positive ETCO2 and breath sounds checked- equal and bilateral Secured at: 23 cm Tube secured with: Tape Dental Injury: Teeth and Oropharynx as per pre-operative assessment

## 2017-09-21 NOTE — Discharge Instructions (Signed)
Tylenol and advil for pain. OK to restart Eliquis, see Dr. [name] in one week. Take Keflex 500 mg one po bid times 5 days. OK to remove dressing in 24 hrs , OK to shower then apply dry dressing. Call Dr. [name]  His cell [phone] for fever , chills , purulent drainage or cellulitis.  Call office for followup visit in about one week.

## 2017-09-21 NOTE — Anesthesia Postprocedure Evaluation (Signed)
Anesthesia Post Note  Patient: Robert Hendrix  Procedure(s) Performed: IRRIGATION AND DEBRIDEMENT EXTREMITY (N/A Arm Upper)     Patient location during evaluation: PACU Anesthesia Type: General Level of consciousness: sedated and patient cooperative Pain management: pain level controlled Vital Signs Assessment: post-procedure vital signs reviewed and stable Respiratory status: spontaneous breathing Cardiovascular status: stable Anesthetic complications: no    Last Vitals:  Vitals:   09/21/17 1600 09/21/17 1610  BP: (!) 141/92 133/85  Pulse: 76 72  Resp: 17 13  Temp:  (!) 36.3 C  SpO2: 97% 98%    Last Pain:  Vitals:   09/21/17 1610  TempSrc:   PainSc: 0-No pain                 Nolon Nations

## 2017-09-21 NOTE — Discharge Summary (Signed)
Patient had a left elbow laceration was taken to the operating room underwent debridement and closure.  Patient was never admitted.  Discharged on Keflex 500 mg p.o. twice daily.  Office follow-up 1 week.
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
## 2017-09-21 NOTE — Interval H&P Note (Signed)
History and Physical Interval Note:  09/21/2017 1:57 PM  Robert Hendrix  has presented today for surgery, with the diagnosis of forearm and elbow lacerations  The various methods of treatment have been discussed with the patient and family. After consideration of risks, benefits and other options for treatment, the patient has consented to  Procedure(s): IRRIGATION AND DEBRIDEMENT EXTREMITY (N/A) as a surgical intervention .  The patient's history has been reviewed, patient examined, no change in status, stable for surgery.  I have reviewed the patient's chart and labs.  Questions were answered to the patient's satisfaction.     [name]
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
## 2017-09-21 NOTE — Anesthesia Preprocedure Evaluation (Signed)
Anesthesia Evaluation  Patient identified by MRN, date of birth, ID band Patient awake    Reviewed: Allergy & Precautions, NPO status , Patient's Chart, lab work & pertinent test results  Airway Mallampati: II  TM Distance: >3 FB Neck ROM: Full    Dental no notable dental hx.    Pulmonary sleep apnea , Current Smoker,    Pulmonary exam normal breath sounds clear to auscultation       Cardiovascular hypertension, Pt. on medications Normal cardiovascular exam Rhythm:Regular Rate:Normal     Neuro/Psych negative neurological ROS  negative psych ROS   GI/Hepatic negative GI ROS, Neg liver ROS,   Endo/Other  negative endocrine ROS  Renal/GU negative Renal ROS     Musculoskeletal  (+) Arthritis ,   Abdominal   Peds  Hematology negative hematology ROS (+)   Anesthesia Other Findings   Reproductive/Obstetrics negative OB ROS                             Anesthesia Physical Anesthesia Plan  ASA: III  Anesthesia Plan: General   Post-op Pain Management:    Induction: Intravenous  PONV Risk Score and Plan: 2 and Ondansetron, Dexamethasone, Midazolam and Treatment may vary due to age or medical condition  Airway Management Planned: LMA and Oral ETT  Additional Equipment:   Intra-op Plan:   Post-operative Plan: Extubation in OR  Informed Consent: I have reviewed the patients History and Physical, chart, labs and discussed the procedure including the risks, benefits and alternatives for the proposed anesthesia with the patient or authorized representative who has indicated his/her understanding and acceptance.   Dental advisory given  Plan Discussed with: CRNA  Anesthesia Plan Comments:         Anesthesia Quick Evaluation

## 2017-09-22 ENCOUNTER — Encounter (HOSPITAL_COMMUNITY): Payer: Self-pay | Admitting: Orthopaedic Surgery

## 2017-09-23 ENCOUNTER — Encounter (INDEPENDENT_AMBULATORY_CARE_PROVIDER_SITE_OTHER): Payer: Self-pay | Admitting: Physician Assistant

## 2017-09-23 ENCOUNTER — Other Ambulatory Visit (HOSPITAL_COMMUNITY): Payer: Self-pay | Admitting: Oral Surgery

## 2017-09-23 ENCOUNTER — Ambulatory Visit (INDEPENDENT_AMBULATORY_CARE_PROVIDER_SITE_OTHER): Payer: 59 | Admitting: Physician Assistant

## 2017-09-23 DIAGNOSIS — S51012S Laceration without foreign body of left elbow, sequela: Secondary | ICD-10-CM | POA: Insufficient documentation

## 2017-09-23 MED ORDER — CLINDAMYCIN HCL 150 MG PO CAPS: 300.0000 mg | ORAL_CAPSULE | Freq: Three times a day (TID) | ORAL | 0 refills | Status: AC

## 2017-09-23 NOTE — Progress Notes (Signed)
HPI: Robert Hendrix comes in today status post irrigation debridement of a left arm laceration.  He underwent an I&D of the left elbow on 09/21/2017.  He comes in today do to malaise and increasing redness about the elbow.  He has had no nausea or vomiting. He is on Tylenol but has had no frank fever.  He is taking Keflex.    Review of systems please see HPI otherwise negative  Physical exam: General well-developed well-nourished male in no acute distress mood and affect appropriate.  Psych alert and oriented x3  Left elbow: Erythema about the elbow.  Sutures well approximated the surgical/laceration wound.  Serosanguineous drainage distal wound but no frank purulence.  Has full extension and good flexion of the left elbow without significant pain.  Forearm compartments are supple.  Impression: Left elbow forearm laceration status post I&D 09/21/2017 Plan: We will stop the Keflex.  We will place him on Clindamycin 300 mg 3 times daily.  He will wash the wound with an antibacterial soap dry it completely.  Then in place clean dry dressing on wound.  Area of demarcation was drawn about the elbow today.  He will follow-up with Dr. [name] tomorrow at 1230 PM for further treatment of the elbow.

## 2017-09-24 ENCOUNTER — Ambulatory Visit (INDEPENDENT_AMBULATORY_CARE_PROVIDER_SITE_OTHER): Payer: 59 | Admitting: Orthopaedic Surgery

## 2017-09-24 ENCOUNTER — Encounter (INDEPENDENT_AMBULATORY_CARE_PROVIDER_SITE_OTHER): Payer: Self-pay | Admitting: Orthopaedic Surgery

## 2017-09-24 VITALS — BP 143/81 | HR 66 | Ht 78.0 in | Wt 220.0 lb

## 2017-09-24 DIAGNOSIS — S51012S Laceration without foreign body of left elbow, sequela: Secondary | ICD-10-CM

## 2017-09-24 NOTE — Progress Notes (Signed)
   Post-Op Visit Note   Patient: Martice Doty           Date of Birth: 12-Feb-1956           MRN: [id number] Visit Date: 09/24/2017 PCP: Shon Baton, MD   Assessment & Plan: Follow-up I&D complex laceration left elbow centered over the olecranon 22 cm in length washed out.  His PCR was negative lives in the hospital.  He had some erythema had slight amount of drainage he thinks may have had a little bit of purulence.  Little bit of erythema around the incision but I am unable to express any purulence today.  No specific cellulitis.  He has been working half days some as a Pharmacist, community.  I will check him back again in a week he has clindamycin currently he also had his Ancef.  He is going to [place] and will take his antibiotics with him.  He can just take the clindamycin and take the Ancef with him.  I plan to recheck him in 1 week he has my phone number to call if he has increased problems.  Chief Complaint:  Chief Complaint  Patient presents with  . Left Elbow - Routine Post Op   Visit Diagnoses:  1. Elbow laceration, left, sequela     Plan: Recheck 1 week.  Follow-Up Instructions: Return in about 1 week (around 10/01/2017).   Orders:  No orders of the defined types were placed in this encounter.  No orders of the defined types were placed in this encounter.   Imaging: No results found.  PMFS History: Patient Active Problem List   Diagnosis Date Noted  . Elbow laceration, left, sequela 09/23/2017   Past Medical History:  Diagnosis Date  . Alcohol abuse   . Chest pain   . Drug abuse ([place])   . Hip pain   . Hyperglycemia   . Hyperlipidemia   . Hypertension   . Low back pain   . Major depression   . Nasal fracture   . Neck pain   . OSA on CPAP   . Osteoarthritis of thumb   . Plantar fasciitis   . Prostatitis   . Psoriasis     Family History  Adopted: Yes    Past Surgical History:  Procedure Laterality Date  . I&D EXTREMITY N/A 09/21/2017   Procedure:  IRRIGATION AND DEBRIDEMENT EXTREMITY;  Surgeon: [name], MD;  Location: [facility];  Service: Orthopedics;  Laterality: N/A;  . NOSE SURGERY  1978   Nasal fracture   . PROSTATE BIOPSY    . WISDOM TOOTH EXTRACTION     Social History   Occupational History  . Not on file  Tobacco Use  . Smoking status: Light Tobacco Smoker    Types: Cigars  . Smokeless tobacco: Never Used  Substance and Sexual Activity  . Alcohol use: No  . Drug use: No  . Sexual activity: Not on file

## 2017-10-01 ENCOUNTER — Encounter (INDEPENDENT_AMBULATORY_CARE_PROVIDER_SITE_OTHER): Payer: Self-pay | Admitting: Orthopaedic Surgery

## 2017-10-01 ENCOUNTER — Ambulatory Visit (INDEPENDENT_AMBULATORY_CARE_PROVIDER_SITE_OTHER): Payer: 59 | Admitting: Orthopaedic Surgery

## 2017-10-01 VITALS — BP 127/81 | HR 72 | Ht 78.0 in | Wt 220.0 lb

## 2017-10-01 DIAGNOSIS — S51012S Laceration without foreign body of left elbow, sequela: Secondary | ICD-10-CM
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
## 2017-10-04 ENCOUNTER — Encounter (HOSPITAL_COMMUNITY): Payer: 59

## 2017-10-04 ENCOUNTER — Encounter: Payer: 59 | Admitting: Vascular Surgery

## 2017-10-04 ENCOUNTER — Encounter (HOSPITAL_COMMUNITY): Payer: Self-pay | Admitting: Orthopaedic Surgery

## 2017-10-08 ENCOUNTER — Ambulatory Visit (INDEPENDENT_AMBULATORY_CARE_PROVIDER_SITE_OTHER): Payer: 59 | Admitting: Orthopaedic Surgery

## 2017-10-08 ENCOUNTER — Encounter (INDEPENDENT_AMBULATORY_CARE_PROVIDER_SITE_OTHER): Payer: Self-pay | Admitting: Orthopaedic Surgery

## 2017-10-08 VITALS — BP 134/76 | HR 72 | Ht 72.0 in | Wt 225.0 lb

## 2017-10-08 DIAGNOSIS — S51012S Laceration without foreign body of left elbow, sequela: Secondary | ICD-10-CM

## 2017-10-08 DIAGNOSIS — S41102A Unspecified open wound of left upper arm, initial encounter: Secondary | ICD-10-CM | POA: Insufficient documentation
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
## 2017-10-15 ENCOUNTER — Ambulatory Visit (INDEPENDENT_AMBULATORY_CARE_PROVIDER_SITE_OTHER): Payer: 59 | Admitting: Orthopaedic Surgery

## 2017-10-15 ENCOUNTER — Encounter (INDEPENDENT_AMBULATORY_CARE_PROVIDER_SITE_OTHER): Payer: Self-pay | Admitting: Orthopaedic Surgery

## 2017-10-15 VITALS — BP 121/72 | HR 69 | Ht 72.0 in | Wt 225.0 lb

## 2017-10-15 DIAGNOSIS — M25422 Effusion, left elbow: Secondary | ICD-10-CM | POA: Diagnosis not present

## 2017-10-15 DIAGNOSIS — S51012S Laceration without foreign body of left elbow, sequela: Secondary | ICD-10-CM

## 2017-10-15 MED ORDER — LIDOCAINE HCL 1 % IJ SOLN
0.5000 mL | INTRAMUSCULAR | Status: AC | PRN
  Administered 2017-10-15: .5 mL
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
## 2017-10-22 ENCOUNTER — Ambulatory Visit (INDEPENDENT_AMBULATORY_CARE_PROVIDER_SITE_OTHER): Payer: 59 | Admitting: Orthopaedic Surgery

## 2017-10-22 ENCOUNTER — Encounter (INDEPENDENT_AMBULATORY_CARE_PROVIDER_SITE_OTHER): Payer: Self-pay | Admitting: Orthopaedic Surgery

## 2017-10-22 VITALS — BP 132/85 | HR 69 | Ht 72.0 in | Wt 225.0 lb

## 2017-10-22 DIAGNOSIS — S51012S Laceration without foreign body of left elbow, sequela: Secondary | ICD-10-CM
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
## 2017-11-20 DIAGNOSIS — H1045 Other chronic allergic conjunctivitis: Secondary | ICD-10-CM | POA: Diagnosis not present

## 2017-12-02 DIAGNOSIS — R82998 Other abnormal findings in urine: Secondary | ICD-10-CM | POA: Diagnosis not present

## 2017-12-03 DIAGNOSIS — Z0000 Encounter for general adult medical examination without abnormal findings: Secondary | ICD-10-CM | POA: Diagnosis not present

## 2017-12-06 DIAGNOSIS — R5383 Other fatigue: Secondary | ICD-10-CM | POA: Diagnosis not present

## 2017-12-06 DIAGNOSIS — Z0000 Encounter for general adult medical examination without abnormal findings: Secondary | ICD-10-CM | POA: Diagnosis not present

## 2017-12-06 DIAGNOSIS — Z23 Encounter for immunization: Secondary | ICD-10-CM | POA: Diagnosis not present

## 2017-12-06 DIAGNOSIS — Z86718 Personal history of other venous thrombosis and embolism: Secondary | ICD-10-CM | POA: Diagnosis not present

## 2017-12-06 DIAGNOSIS — Z1389 Encounter for screening for other disorder: Secondary | ICD-10-CM | POA: Diagnosis not present

## 2017-12-12 DIAGNOSIS — M5416 Radiculopathy, lumbar region: Secondary | ICD-10-CM | POA: Diagnosis not present

## 2017-12-12 DIAGNOSIS — M4316 Spondylolisthesis, lumbar region: Secondary | ICD-10-CM | POA: Diagnosis not present

## 2017-12-16 DIAGNOSIS — Z1212 Encounter for screening for malignant neoplasm of rectum: Secondary | ICD-10-CM | POA: Diagnosis not present

## 2018-02-18 ENCOUNTER — Encounter: Payer: Self-pay | Admitting: Neurology

## 2018-02-20 ENCOUNTER — Encounter: Payer: Self-pay | Admitting: Neurology

## 2018-02-20 ENCOUNTER — Ambulatory Visit (INDEPENDENT_AMBULATORY_CARE_PROVIDER_SITE_OTHER): Payer: 59 | Admitting: Neurology

## 2018-02-20 VITALS — BP 132/78 | HR 68 | Ht 72.0 in | Wt 228.0 lb

## 2018-02-20 DIAGNOSIS — Z9989 Dependence on other enabling machines and devices: Secondary | ICD-10-CM

## 2018-02-20 DIAGNOSIS — G4733 Obstructive sleep apnea (adult) (pediatric): Secondary | ICD-10-CM | POA: Diagnosis not present
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
## 2018-03-04 DIAGNOSIS — G4730 Sleep apnea, unspecified: Secondary | ICD-10-CM | POA: Diagnosis not present

## 2018-03-04 DIAGNOSIS — G4733 Obstructive sleep apnea (adult) (pediatric): Secondary | ICD-10-CM | POA: Diagnosis not present

## 2018-05-15 DIAGNOSIS — M4316 Spondylolisthesis, lumbar region: Secondary | ICD-10-CM | POA: Diagnosis not present

## 2018-05-15 DIAGNOSIS — M5412 Radiculopathy, cervical region: Secondary | ICD-10-CM | POA: Diagnosis not present

## 2018-06-04 DIAGNOSIS — G4730 Sleep apnea, unspecified: Secondary | ICD-10-CM | POA: Diagnosis not present

## 2018-06-04 DIAGNOSIS — G4733 Obstructive sleep apnea (adult) (pediatric): Secondary | ICD-10-CM | POA: Diagnosis not present

## 2018-07-25 ENCOUNTER — Other Ambulatory Visit: Payer: Self-pay | Admitting: Internal Medicine

## 2018-07-25 ENCOUNTER — Ambulatory Visit (HOSPITAL_COMMUNITY): Admission: RE | Admit: 2018-07-25 | Payer: 59 | Source: Ambulatory Visit

## 2018-07-25 ENCOUNTER — Other Ambulatory Visit (HOSPITAL_COMMUNITY): Payer: Self-pay | Admitting: Internal Medicine

## 2018-07-25 DIAGNOSIS — R202 Paresthesia of skin: Secondary | ICD-10-CM

## 2018-07-25 DIAGNOSIS — R296 Repeated falls: Secondary | ICD-10-CM

## 2018-07-25 DIAGNOSIS — F8089 Other developmental disorders of speech and language: Secondary | ICD-10-CM | POA: Diagnosis not present

## 2018-07-28 ENCOUNTER — Ambulatory Visit (HOSPITAL_COMMUNITY)
Admission: RE | Admit: 2018-07-28 | Discharge: 2018-07-28 | Disposition: A | Discharge status: Home / Self Care | Payer: 59 | Source: Ambulatory Visit | Attending: Internal Medicine | Admitting: Internal Medicine

## 2018-07-28 ENCOUNTER — Other Ambulatory Visit: Payer: Self-pay

## 2018-07-28 DIAGNOSIS — F8089 Other developmental disorders of speech and language: Secondary | ICD-10-CM | POA: Diagnosis not present

## 2018-07-28 DIAGNOSIS — R202 Paresthesia of skin: Secondary | ICD-10-CM | POA: Diagnosis not present

## 2018-07-28 DIAGNOSIS — R296 Repeated falls: Secondary | ICD-10-CM | POA: Diagnosis not present

## 2018-07-28 DIAGNOSIS — R413 Other amnesia: Secondary | ICD-10-CM | POA: Diagnosis not present

## 2018-07-28 MED ORDER — GADOBUTROL 1 MMOL/ML IV SOLN
10.0000 mL | Freq: Once | INTRAVENOUS | Status: AC | PRN
  Administered 2018-07-28: 10 mL via INTRAVENOUS

## 2018-08-05 DIAGNOSIS — G4733 Obstructive sleep apnea (adult) (pediatric): Secondary | ICD-10-CM | POA: Diagnosis not present

## 2018-08-05 DIAGNOSIS — G4730 Sleep apnea, unspecified: Secondary | ICD-10-CM | POA: Diagnosis not present

## 2019-02-23 ENCOUNTER — Ambulatory Visit (INDEPENDENT_AMBULATORY_CARE_PROVIDER_SITE_OTHER): Payer: 59 | Admitting: Neurology

## 2019-02-23 ENCOUNTER — Other Ambulatory Visit: Payer: Self-pay

## 2019-02-23 ENCOUNTER — Encounter: Payer: Self-pay | Admitting: Neurology

## 2019-02-23 VITALS — BP 138/78 | HR 66 | Temp 97.8°F | Ht 71.0 in | Wt 222.0 lb

## 2019-02-23 DIAGNOSIS — Z9989 Dependence on other enabling machines and devices: Secondary | ICD-10-CM | POA: Diagnosis not present

## 2019-02-23 DIAGNOSIS — G4733 Obstructive sleep apnea (adult) (pediatric): Secondary | ICD-10-CM
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
## 2019-02-23 NOTE — Patient Instructions (Signed)
You can try the bella swift with ear loops if you like to see that model- it has less headgear.

## 2019-03-03 MED FILL — ATORVASTATIN 10 MG TABLET: 10 | 30 days supply | Qty: 30 | Fill #0

## 2019-03-03 MED FILL — VENLAFAXINE HCL ER 150 MG C: 150 | 30 days supply | Qty: 30 | Fill #0

## 2019-03-07 ENCOUNTER — Other Ambulatory Visit: Payer: Self-pay

## 2019-03-07 ENCOUNTER — Emergency Department (HOSPITAL_COMMUNITY)
Admission: EM | Admit: 2019-03-07 | Discharge: 2019-03-07 | Disposition: A | Discharge status: Home / Self Care | Payer: 59 | Attending: Emergency Medicine | Admitting: Emergency Medicine

## 2019-03-07 ENCOUNTER — Encounter (HOSPITAL_COMMUNITY): Payer: Self-pay

## 2019-03-07 ENCOUNTER — Emergency Department (HOSPITAL_COMMUNITY): Payer: 59

## 2019-03-07 DIAGNOSIS — S299XXA Unspecified injury of thorax, initial encounter: Secondary | ICD-10-CM | POA: Diagnosis present

## 2019-03-07 DIAGNOSIS — I10 Essential (primary) hypertension: Secondary | ICD-10-CM | POA: Insufficient documentation

## 2019-03-07 DIAGNOSIS — Y9355 Activity, bike riding: Secondary | ICD-10-CM | POA: Insufficient documentation

## 2019-03-07 DIAGNOSIS — Y9289 Other specified places as the place of occurrence of the external cause: Secondary | ICD-10-CM | POA: Diagnosis not present

## 2019-03-07 DIAGNOSIS — S2232XA Fracture of one rib, left side, initial encounter for closed fracture: Secondary | ICD-10-CM

## 2019-03-07 DIAGNOSIS — Z79899 Other long term (current) drug therapy: Secondary | ICD-10-CM | POA: Diagnosis not present

## 2019-03-07 DIAGNOSIS — Y999 Unspecified external cause status: Secondary | ICD-10-CM | POA: Insufficient documentation

## 2019-03-07 DIAGNOSIS — F17290 Nicotine dependence, other tobacco product, uncomplicated: Secondary | ICD-10-CM | POA: Diagnosis not present
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
## 2019-03-07 NOTE — Discharge Instructions (Addendum)
As discussed, your evaluation today has demonstrated the presence of a fractured rib.  The interpretation from your x-ray is below: IMPRESSION: 1. Acute nondisplaced fracture of the left anterolateral fourth rib. 2.  No active cardiopulmonary disease.  Tylenol, ibuprofen and topical pain patches including the ingredients of lidocaine and methyl salicylate are appropriate for pain control.  Gale Journey Pas is one brand that is available with this combination.

## 2019-03-17 MED FILL — predniSONE 10 MG TABS: 10 | 4 days supply | Qty: 5 | Fill #0

## 2019-03-27 MED FILL — AMITRIPTYLINE HCL 25 MG TAB: 25 | 30 days supply | Qty: 30 | Fill #0

## 2019-04-06 MED FILL — ATORVASTATIN 10 MG TABLET: 10 | 30 days supply | Qty: 30 | Fill #1

## 2019-04-14 MED FILL — FLUCONAZOLE 100 MG TABLET: 100 | 16 days supply | Qty: 8 | Fill #0

## 2019-04-20 MED FILL — VENLAFAXINE HCL ER 150 MG C: 150 | 30 days supply | Qty: 30 | Fill #1

## 2019-05-04 ENCOUNTER — Other Ambulatory Visit: Payer: Self-pay

## 2019-05-04 ENCOUNTER — Encounter: Payer: Self-pay | Admitting: Neurology

## 2019-05-04 ENCOUNTER — Ambulatory Visit (INDEPENDENT_AMBULATORY_CARE_PROVIDER_SITE_OTHER): Payer: 59 | Admitting: Neurology

## 2019-05-04 ENCOUNTER — Encounter

## 2019-05-04 DIAGNOSIS — G5622 Lesion of ulnar nerve, left upper limb: Secondary | ICD-10-CM

## 2019-05-04 HISTORY — DX: Lesion of ulnar nerve, left upper limb: G56.22

## 2019-05-04 NOTE — Progress Notes (Signed)
Please refer to EMG and nerve conduction procedure note.  
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
## 2019-05-05 NOTE — Progress Notes (Signed)
[facility]    Nerve / Sites Muscle Latency Ref. Amplitude Ref. Rel Amp Segments Distance Velocity Ref. Area    ms ms mV mV %  cm m/s m/s mVms  L Median - APB     Wrist APB 4.0 ?4.4 6.0 ?4.0 100 Wrist - APB 7   17.6     Upper arm APB 8.4  5.3  88.2 Upper arm - Wrist 24 54 ?49 16.3  L Ulnar - ADM     Wrist ADM 2.9 ?3.3 8.3 ?6.0 100 Wrist - ADM 7   21.3     B.Elbow ADM 6.5  7.5  90.1 B.Elbow - Wrist 22 61 ?49 19.9     A.Elbow ADM 9.3  7.1  95.3 A.Elbow - B.Elbow 10 36 ?49 19.9         A.Elbow - Wrist      R Ulnar - ADM     Wrist ADM 2.5 ?3.3 8.4 ?6.0 100 Wrist - ADM 7   21.1     B.Elbow ADM 6.6  8.1  96 B.Elbow - Wrist 22 54 ?49 21.1     A.Elbow ADM 8.5  7.9  97.2 A.Elbow - B.Elbow 10 54 ?49 20.8         A.Elbow - Wrist               SNC    Nerve / Sites Rec. Site Peak Lat Ref.  Amp Ref. Segments Distance    ms ms V V  cm  L Median - Orthodromic (Dig II, Mid palm)     Dig II Wrist 3.1 ?3.4 11 ?10 Dig II - Wrist 13  L Ulnar - Orthodromic, (Dig V, Mid palm)     Dig V Wrist 2.9 ?3.1 5 ?5 Dig V - Wrist 11  R Ulnar - Orthodromic, (Dig V, Mid palm)     Dig V Wrist 2.8 ?3.1 5 ?5 Dig V - Wrist 20           F  Wave    Nerve F Lat Ref.   ms ms  L Ulnar - ADM 33.6 ?32.0

## 2019-05-11 MED FILL — ATORVASTATIN 10 MG TABLET: 10 | 30 days supply | Qty: 30 | Fill #2

## 2019-05-18 MED FILL — VENLAFAXINE HCL ER 150 MG C: 150 | 30 days supply | Qty: 30 | Fill #2

## 2019-06-15 MED FILL — VENLAFAXINE HCL ER 150 MG C: 150 | 30 days supply | Qty: 30 | Fill #3

## 2019-06-15 MED FILL — ATORVASTATIN 10 MG TABLET: 10 | 30 days supply | Qty: 30 | Fill #3

## 2019-06-23 MED FILL — GABAPENTIN 300 MG CAPSULE: 300 | 20 days supply | Qty: 60 | Fill #0

## 2019-06-23 MED FILL — METHOCARBAMOL 500 MG TABS: 500 | 20 days supply | Qty: 60 | Fill #0

## 2019-06-23 MED FILL — HYDROCODON-APAP 5-325: 5-325 | 7 days supply | Qty: 30 | Fill #0

## 2019-07-13 MED FILL — ATORVASTATIN 10 MG TABLET: 10 | 30 days supply | Qty: 30 | Fill #4

## 2019-07-13 MED FILL — VENLAFAXINE HCL ER 150 MG C: 150 | 30 days supply | Qty: 30 | Fill #4

## 2019-08-17 MED FILL — ATORVASTATIN 10 MG TABLET: 10 | 30 days supply | Qty: 30 | Fill #5

## 2019-08-17 MED FILL — VENLAFAXINE HCL ER 150 MG C: 150 | 30 days supply | Qty: 30 | Fill #5

## 2019-08-26 ENCOUNTER — Other Ambulatory Visit: Payer: Self-pay

## 2019-08-26 ENCOUNTER — Ambulatory Visit (INDEPENDENT_AMBULATORY_CARE_PROVIDER_SITE_OTHER): Payer: 59 | Admitting: Plastic Surgery

## 2019-08-26 VITALS — BP 136/87 | HR 79 | Temp 98.4°F | Ht 72.0 in | Wt 230.0 lb

## 2019-08-26 DIAGNOSIS — G5622 Lesion of ulnar nerve, left upper limb: Secondary | ICD-10-CM | POA: Diagnosis not present

## 2019-08-26 NOTE — Progress Notes (Signed)
Referring Provider Shon Baton, MD [address] [place],  [place] [address]   CC: No chief complaint on file. Left hand weakness  Robert Hendrix is an 64 y.o. male.  HPI: Patient is here for left hand weakness.  His issues with his left hand started about a year ago when he noticed some numbness and tingling in his small and ring fingers.  Subsequent work-up included an MRI and a nerve conduction study that suggested spinal root compression in the neck and cubital tunnel syndrome.  He had a cervical fusion and cubital tunnel release by [name] this past February.  He did not notice much additional change immediately after the surgery and over the past week or so he has noticed some weakness in his left hand and inability to move his small and ring fingers as he normally does.  The numbness is still present and potentially worse in the small and ring fingers and has extended into the long finger as well.  He is curious to see if there is any specific hand etiology that could be causing this.  No Known Allergies  Outpatient Encounter Medications as of 08/26/2019  Medication Sig  . atorvastatin (LIPITOR) 10 MG tablet Take 10 mg by mouth daily.  . cholecalciferol (VITAMIN D) 1000 units tablet Take 1,000 Units by mouth daily.  Marland Kitchen glucosamine-chondroitin 500-400 MG tablet Take 1 tablet by mouth daily.  Marland Kitchen ibuprofen (ADVIL,MOTRIN) 800 MG tablet Take 800 mg by mouth 3 (three) times daily as needed for moderate pain.   . Multiple Vitamins-Minerals (MULTIVITAMIN ADULTS PO) Take 1 tablet by mouth daily.  . niacin (SLO-NIACIN) 500 MG tablet Take 500 mg by mouth daily.  . valACYclovir (VALTREX) 500 MG tablet Take 500 mg by mouth daily.  Marland Kitchen venlafaxine XR (EFFEXOR-XR) 150 MG 24 hr capsule Take 150 mg by mouth daily with breakfast.  . vitamin E (VITAMIN E) 400 UNIT capsule Take 400 Units by mouth daily.   Facility-Administered Encounter Medications as of 08/26/2019  Medication  . 0.9 %  sodium chloride  infusion     Past Medical History:  Diagnosis Date  . Alcohol abuse   . Chest pain   . Drug abuse ([place])   . Hip pain   . Hyperglycemia   . Hyperlipidemia   . Hypertension   . Low back pain   . Major depression   . Nasal fracture   . Neck pain   . OSA on CPAP   . Osteoarthritis of thumb   . Plantar fasciitis   . Prostatitis   . Psoriasis   . Ulnar neuropathy at elbow of left upper extremity 05/04/2019    Past Surgical History:  Procedure Laterality Date  . I & D EXTREMITY N/A 09/21/2017   Procedure: IRRIGATION AND DEBRIDEMENT EXTREMITY;  Surgeon: [name], MD;  Location: [facility];  Service: Orthopedics;  Laterality: N/A;  . NOSE SURGERY  1978   Nasal fracture   . PROSTATE BIOPSY    . WISDOM TOOTH EXTRACTION      Family History  Adopted: Yes    Social History   Social History Narrative   Lives with wife, Hope   Caffeine use: 6-8 cups per day   Right handed      Review of Systems General: Denies fevers, chills, weight loss CV: Denies chest pain, shortness of breath, palpitations  Physical Exam Vitals with BMI 08/26/2019 03/07/2019 03/07/2019  Height 6\' 0"  - -  Weight 230 lbs - -  BMI  [id number] - -  Systolic [id number]  Diastolic 87 82 84  Pulse 79 74 65    General:  No acute distress,  Alert and oriented, Non-Toxic, Normal speech and affect Left upper extremity: Fingers well-perfused with normal capillary refill and a palp radial pulse.  Sensation appears to be intact in the thumb and index finger.  The small finger and ulnar half of the ring finger are mostly numb.  He also has a relative sensory decrease in the radial side of the ring finger and the long finger.  He reports numbness over the hyperthenar eminence in the palm and also over the dorsal dorsal ulnar aspect of the hand.  He does not report any numbness extending up the forearm.  In terms of motion he can fully flex and extend all fingers and thumb.  On extension there is a bit of MP extension lag in  the small and ring fingers.  His intrinsic musculature is weak particularly on small finger abduction.  His first dorsal interosseous muscle is clearly firing and he can resist index finger adduction.  His thenar musculature appears strong.  He can activate the ulnar to lumbricals but he describes it requiring quite a bit of effort.  Provocative test for carpal and cubital tunnel syndrome are negative.  He denies a Tinel's sensation over Guyon's canal or the cubital tunnel.  He can clearly activate the FDP to all fingers.  The FCU is also functioning well.  He does not demonstrate any weakness in wrist or elbow movement.  He has a 5 to 6 cm scar over the cubital tunnel with no hypersensitivity in that area.  I do not palpate any thickened palmar fascia to suggest Dupuytren's contracture.  Assessment/Plan Patient presents with a incomplete high ulnar nerve palsy.  I discussed my concern that this could be related to his nerve roots.  The timeline of worsening of function to me does not support an injury or problem related to the cubital tunnel release.  I recommended that he get a repeat MRI of his neck and further nerve studies to compare to his last set that was done about 6 months ago.  He is seeing his neurosurgeon [name] later this afternoon and I suspect that he will direct that work-up.  If not I am happy to do so and the patient will contact me if that is necessary.  Patient has agreed to keep me updated and I will look for the results of those studies and will arrange for the visit accordingly.  All of his questions were answered.  [name] 08/26/2019, 1:52 PM

## 2019-08-27 MED FILL — METHYLPREDNISOLONE 4 MG TAB: 4 | 6 days supply | Qty: 21 | Fill #0

## 2019-09-07 MED FILL — VALACYCLOVIR HCL 500 MG TAB: 500 | 30 days supply | Qty: 30 | Fill #0

## 2019-09-10 ENCOUNTER — Institutional Professional Consult (permissible substitution): Payer: 59 | Admitting: Plastic Surgery

## 2019-09-14 MED FILL — VENLAFAXINE HCL ER 150 MG C: 150 | 30 days supply | Qty: 30 | Fill #6

## 2019-09-14 MED FILL — ATORVASTATIN 10 MG TABLET: 10 | 30 days supply | Qty: 30 | Fill #6

## 2019-10-12 DIAGNOSIS — R29898 Other symptoms and signs involving the musculoskeletal system: Secondary | ICD-10-CM | POA: Insufficient documentation

## 2019-10-13 MED FILL — ATORVASTATIN CALCIUM 10 MG: 10 | 30 days supply | Qty: 30 | Fill #7

## 2019-10-13 MED FILL — VALACYCLOVIR HCL 500 MG TAB: 500 | 30 days supply | Qty: 30 | Fill #1

## 2019-10-13 MED FILL — VENLAFAXINE HCL ER 150 MG C: 150 | 30 days supply | Qty: 30 | Fill #7

## 2019-11-11 MED FILL — ATORVASTATIN CALCIUM 10 MG: 10 | 30 days supply | Qty: 30 | Fill #8

## 2019-11-11 MED FILL — VENLAFAXINE HCL ER 150 MG C: 150 | 30 days supply | Qty: 30 | Fill #8

## 2019-12-07 MED FILL — VALACYCLOVIR HCL 500 MG TAB: 500 | 30 days supply | Qty: 30 | Fill #2

## 2019-12-18 MED FILL — VENLAFAXINE HCL ER 150 MG C: 150 | 30 days supply | Qty: 30 | Fill #9

## 2019-12-18 MED FILL — ATORVASTATIN CALCIUM 10 MG: 10 | 30 days supply | Qty: 30 | Fill #9

## 2020-01-11 MED FILL — VALACYCLOVIR HCL 500 MG TAB: 500 | 30 days supply | Qty: 30 | Fill #3

## 2020-01-11 MED FILL — VENLAFAXINE HCL ER 150 MG C: 150 | 30 days supply | Qty: 30 | Fill #10

## 2020-01-11 MED FILL — ATORVASTATIN CALCIUM 10 MG: 10 | 30 days supply | Qty: 30 | Fill #10

## 2020-02-11 MED FILL — ATORVASTATIN CALCIUM 10 MG: 10 | 30 days supply | Qty: 30 | Fill #11

## 2020-02-11 MED FILL — VALACYCLOVIR HCL 500 MG TAB: 500 | 30 days supply | Qty: 30 | Fill #4

## 2020-02-11 MED FILL — VENLAFAXINE HCL ER 150 MG C: 150 | 30 days supply | Qty: 30 | Fill #11

## 2020-02-23 ENCOUNTER — Ambulatory Visit (INDEPENDENT_AMBULATORY_CARE_PROVIDER_SITE_OTHER): Payer: 59 | Admitting: Family Medicine

## 2020-02-23 ENCOUNTER — Other Ambulatory Visit: Payer: Self-pay

## 2020-02-23 ENCOUNTER — Encounter: Payer: Self-pay | Admitting: Family Medicine

## 2020-02-23 VITALS — BP 139/81 | HR 63 | Ht 72.0 in | Wt 226.0 lb

## 2020-02-23 DIAGNOSIS — G4733 Obstructive sleep apnea (adult) (pediatric): Secondary | ICD-10-CM

## 2020-02-23 DIAGNOSIS — Z9989 Dependence on other enabling machines and devices: Secondary | ICD-10-CM
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
## 2020-02-23 NOTE — Progress Notes (Addendum)
Order for cpap supplies and travel cpap supplies sent to [facility] via community msg. Confirmation received that the order transmitted was successful.

## 2020-02-23 NOTE — Patient Instructions (Signed)
Please continue using your CPAP regularly. While your insurance requires that you use CPAP at least 4 hours each night on 70% of the nights, I recommend, that you not skip any nights and use it throughout the night if you can. Getting used to CPAP and staying with the treatment long term does take time and patience and discipline. Untreated obstructive sleep apnea when it is moderate to severe can have an adverse impact on cardiovascular health and raise her risk for heart disease, arrhythmias, hypertension, congestive heart failure, stroke and diabetes. Untreated obstructive sleep apnea causes sleep disruption, nonrestorative sleep, and sleep deprivation. This can have an impact on your day to day functioning and cause daytime sleepiness and impairment of cognitive function, memory loss, mood disturbance, and problems focussing. Using CPAP regularly can improve these symptoms.   Follow up in 1 year   Sleep Apnea Sleep apnea affects breathing during sleep. It causes breathing to stop for a short time or to become shallow. It can also increase the risk of:  Heart attack.  Stroke.  Being very overweight (obese).  Diabetes.  Heart failure.  Irregular heartbeat. The goal of treatment is to help you breathe normally again. What are the causes? There are three kinds of sleep apnea:  Obstructive sleep apnea. This is caused by a blocked or collapsed airway.  Central sleep apnea. This happens when the brain does not send the right signals to the muscles that control breathing.  Mixed sleep apnea. This is a combination of obstructive and central sleep apnea. The most common cause of this condition is a collapsed or blocked airway. This can happen if:  Your throat muscles are too relaxed.  Your tongue and tonsils are too large.  You are overweight.  Your airway is too small. What increases the risk?  Being overweight.  Smoking.  Having a small airway.  Being older.  Being  male.  Drinking alcohol.  Taking medicines to calm yourself (sedatives or tranquilizers).  Having family members with the condition. What are the signs or symptoms?  Trouble staying asleep.  Being sleepy or tired during the day.  Getting angry a lot.  Loud snoring.  Headaches in the morning.  Not being able to focus your mind (concentrate).  Forgetting things.  Less interest in sex.  Mood swings.  Personality changes.  Feelings of sadness (depression).  Waking up a lot during the night to pee (urinate).  Dry mouth.  Sore throat. How is this diagnosed?  Your medical history.  A physical exam.  A test that is done when you are sleeping (sleep study). The test is most often done in a sleep lab but may also be done at home. How is this treated?   Sleeping on your side.  Using a medicine to get rid of mucus in your nose (decongestant).  Avoiding the use of alcohol, medicines to help you relax, or certain pain medicines (narcotics).  Losing weight, if needed.  Changing your diet.  Not smoking.  Using a machine to open your airway while you sleep, such as: ? An oral appliance. This is a mouthpiece that shifts your lower jaw forward. ? A CPAP device. This device blows air through a mask when you breathe out (exhale). ? An EPAP device. This has valves that you put in each nostril. ? A BPAP device. This device blows air through a mask when you breathe in (inhale) and breathe out.  Having surgery if other treatments do not work. It is   important to get treatment for sleep apnea. Without treatment, it can lead to:  High blood pressure.  Coronary artery disease.  In men, not being able to have an erection (impotence).  Reduced thinking ability. Follow these instructions at home: Lifestyle  Make changes that your doctor recommends.  Eat a healthy diet.  Lose weight if needed.  Avoid alcohol, medicines to help you relax, and some pain  medicines.  Do not use any products that contain nicotine or tobacco, such as cigarettes, e-cigarettes, and chewing tobacco. If you need help quitting, ask your doctor. General instructions  Take over-the-counter and prescription medicines only as told by your doctor.  If you were given a machine to use while you sleep, use it only as told by your doctor.  If you are having surgery, make sure to tell your doctor you have sleep apnea. You may need to bring your device with you.  Keep all follow-up visits as told by your doctor. This is important. Contact a doctor if:  The machine that you were given to use during sleep bothers you or does not seem to be working.  You do not get better.  You get worse. Get help right away if:  Your chest hurts.  You have trouble breathing in enough air.  You have an uncomfortable feeling in your back, arms, or stomach.  You have trouble talking.  One side of your body feels weak.  A part of your face is hanging down. These symptoms may be an emergency. Do not wait to see if the symptoms will go away. Get medical help right away. Call your local emergency services (911 in the [place] Do not drive yourself to the hospital. Summary  This condition affects breathing during sleep.  The most common cause is a collapsed or blocked airway.  The goal of treatment is to help you breathe normally while you sleep. This information is not intended to replace advice given to you by your health care provider. Make sure you discuss any questions you have with your health care provider. Document Revised: 02/14/2018 Document Reviewed: 12/24/2017 Elsevier Patient Education  2020 Elsevier Inc.  

## 2020-02-24 ENCOUNTER — Ambulatory Visit (INDEPENDENT_AMBULATORY_CARE_PROVIDER_SITE_OTHER): Payer: 59 | Admitting: Neurology

## 2020-02-24 ENCOUNTER — Other Ambulatory Visit: Payer: Self-pay

## 2020-02-24 ENCOUNTER — Encounter: Payer: Self-pay | Admitting: Neurology

## 2020-02-24 DIAGNOSIS — G5622 Lesion of ulnar nerve, left upper limb: Secondary | ICD-10-CM | POA: Diagnosis not present

## 2020-02-24 NOTE — Progress Notes (Signed)
Please refer to EMG and nerve conduction procedure note.  

## 2020-02-24 NOTE — Procedures (Signed)
     HISTORY:  Robert Hendrix is a 64 year old gentleman with a history of a left ulnar neuropathy elbow, he has had cervical spine surgery and decompressive surgery for the left ulnar nerve in February 2021.  The patient continues to have some numbness of the left hand, he is being reevaluated for this issue.  NERVE CONDUCTION STUDIES:  Nerve conduction studies were performed on the left upper extremity.  The distal motor latencies and motor amplitudes for the left median and ulnar nerves were normal.  The nerve conduction velocity for the left median nerve is normal and is normal for the left ulnar nerve below the elbow but there is significant slowing when stimulated above the elbow.  The sensory latencies for the left radial, median, and ulnar nerves were normal.  The F-wave latency for the left ulnar nerve was prolonged.  EMG STUDIES:  EMG study was performed on the left upper extremity:  The first dorsal interosseous muscle reveals 2 to 4 K units with decreased recruitment. No fibrillations or positive waves were noted. The abductor pollicis brevis muscle reveals 2 to 4 K units with decreased recruitment. No fibrillations or positive waves were noted. The extensor indicis proprius muscle reveals 1 to 4 K units with decreased recruitment. No fibrillations or positive waves were noted. The pronator teres muscle reveals 2 to 3 K units with full recruitment. No fibrillations or positive waves were noted. The flexor digitorum profundus muscle (III-IV) reveals 2-5 K units with decreased recruitment.  No fibrillations or positive waves were seen. The biceps muscle reveals 1 to 2 K units with full recruitment. No fibrillations or positive waves were noted. The triceps muscle reveals 2 to 6 K units with decreased recruitment. No fibrillations or positive waves were noted. The anterior deltoid muscle reveals 2 to 3 K units with full recruitment. No fibrillations or positive waves were  noted.   IMPRESSION:  Nerve conduction studies done on the left upper extremity continue to show significant slowing of the left ulnar nerve across the elbow.  The study suggests that the lesion is likely to be demyelinating in nature.  This finding is similar to what was seen previously in December 2020.  The EMG evaluation of the left upper extremity shows chronic stable neuropathic denervation in several C8 innervated muscles suggesting a chronic stable overlying C8 radiculopathy.  The EMG findings are unchanged from prior study done in December 2020.   Jill Alexanders MD 02/24/2020 4:15 PM  [facility] [address] [place], [place] [address]  Phone [phone] Fax [phone]

## 2020-02-25 NOTE — Progress Notes (Signed)
[facility]    Nerve / Sites Muscle Latency Ref. Amplitude Ref. Rel Amp Segments Distance Velocity Ref. Area    ms ms mV mV %  cm m/s m/s mVms  L Median - APB     Wrist APB 4.1 ?4.4 6.6 ?4.0 100 Wrist - APB 7   19.5     Upper arm APB 8.2  6.3  95.3 Upper arm - Wrist 23 56 ?49 19.4  L Ulnar - ADM     Wrist ADM 3.2 ?3.3 8.4 ?6.0 100 Wrist - ADM 7   20.2     B.Elbow ADM 6.9  8.2  97.3 B.Elbow - Wrist 22 59 ?49 18.5     A.Elbow ADM 9.8  7.8  95.1 A.Elbow - B.Elbow 10 35 ?49 21.3         A.Elbow - Wrist             SNC    Nerve / Sites Rec. Site Peak Lat Ref.  Amp Ref. Segments Distance    ms ms V V  cm  L Radial - Anatomical snuff box (Forearm)     Forearm Wrist 2.3 ?2.9 29 ?15 Forearm - Wrist 10  L Median - Orthodromic (Dig II, Mid palm)     Dig II Wrist 3.3 ?3.4 14 ?10 Dig II - Wrist 13  L Ulnar - Orthodromic, (Dig V, Mid palm)     Dig V Wrist 2.6 ?3.1 3 ?5 Dig V - Wrist 81           F  Wave    Nerve F Lat Ref.   ms ms  L Ulnar - ADM 33.3 ?32.0

## 2020-03-10 ENCOUNTER — Other Ambulatory Visit (HOSPITAL_COMMUNITY): Payer: Self-pay | Admitting: Internal Medicine

## 2020-03-10 MED FILL — VALACYCLOVIR HCL 500 MG TAB: 500 | 30 days supply | Qty: 30 | Fill #0

## 2020-03-10 MED FILL — VENLAFAXINE HCL ER 150 MG C: 150 | 30 days supply | Qty: 30 | Fill #0

## 2020-03-10 MED FILL — ATORVASTATIN CALCIUM 10 MG: 10 | 30 days supply | Qty: 30 | Fill #0

## 2020-03-14 ENCOUNTER — Other Ambulatory Visit (HOSPITAL_COMMUNITY): Payer: Self-pay | Admitting: Internal Medicine

## 2020-03-14 MED FILL — FLUCONAZOLE 100 MG TABLET: 100 | 16 days supply | Qty: 8 | Fill #0

## 2020-04-11 MED FILL — VALACYCLOVIR HCL 500 MG TAB: 500 | 30 days supply | Qty: 30 | Fill #1

## 2020-04-11 MED FILL — VENLAFAXINE HCL ER 150 MG C: 150 | 30 days supply | Qty: 30 | Fill #1

## 2020-04-11 MED FILL — ATORVASTATIN CALCIUM 10 MG: 10 | 30 days supply | Qty: 30 | Fill #1

## 2020-05-09 MED FILL — VALACYCLOVIR HCL 500 MG TAB: 500 | 30 days supply | Qty: 30 | Fill #2

## 2020-05-16 MED FILL — VENLAFAXINE HCL ER 150 MG C: 150 | 30 days supply | Qty: 30 | Fill #2

## 2020-05-20 ENCOUNTER — Ambulatory Visit (AMBULATORY_SURGERY_CENTER): Payer: Self-pay

## 2020-05-20 ENCOUNTER — Other Ambulatory Visit: Payer: Self-pay

## 2020-05-20 ENCOUNTER — Other Ambulatory Visit: Payer: Self-pay | Admitting: Gastroenterology

## 2020-05-20 VITALS — Ht 72.0 in | Wt 226.0 lb

## 2020-05-20 DIAGNOSIS — Z86010 Personal history of colonic polyps: Secondary | ICD-10-CM

## 2020-05-20 MED ORDER — PLENVU 140 G PO SOLR: 1.0000 | ORAL | 0 refills | Status: DC

## 2020-05-20 MED FILL — PLENVU 140 GM SOLR: 140 | 1 days supply | Qty: 3 | Fill #0

## 2020-05-20 NOTE — Progress Notes (Signed)
No egg or soy allergy known to patient  No issues with past sedation with any surgeries or procedures No intubation problems in the past  No FH of Malignant Hyperthermia No diet pills per patient No home 02 use per patient  No blood thinners per patient  Pt denies issues with constipation  No A fib or A flutter  EMMI video to pt or via MyChart  COVID 19 guidelines implemented in PV today with Pt and RN  Pt is fully vaccinated  for Covid + booster Coupon given to pt in PV today , Code to Pharmacy  Due to the COVID-19 pandemic we are asking patients to follow certain guidelines.  Pt aware of COVID protocols and [facility] guidelines   

## 2020-05-30 ENCOUNTER — Encounter: Payer: Self-pay | Admitting: Gastroenterology

## 2020-06-03 ENCOUNTER — Encounter: Payer: 59 | Admitting: Gastroenterology

## 2020-06-06 ENCOUNTER — Other Ambulatory Visit: Payer: Self-pay

## 2020-06-06 ENCOUNTER — Encounter: Payer: Self-pay | Admitting: Gastroenterology

## 2020-06-06 ENCOUNTER — Ambulatory Visit (AMBULATORY_SURGERY_CENTER): Payer: 59 | Admitting: Gastroenterology

## 2020-06-06 VITALS — BP 117/71 | HR 57 | Temp 97.3°F | Resp 14 | Ht 72.0 in | Wt 226.0 lb

## 2020-06-06 DIAGNOSIS — Z860100 Personal history of colon polyps, unspecified: Secondary | ICD-10-CM

## 2020-06-06 DIAGNOSIS — D123 Benign neoplasm of transverse colon: Secondary | ICD-10-CM

## 2020-06-06 DIAGNOSIS — D122 Benign neoplasm of ascending colon: Secondary | ICD-10-CM | POA: Diagnosis not present

## 2020-06-06 MED ORDER — SODIUM CHLORIDE 0.9 % IV SOLN: 500.0000 mL | Freq: Once | INTRAVENOUS | Status: DC

## 2020-06-06 NOTE — Progress Notes (Signed)
Report to PACU, RN, vss, BBS= Clear.  

## 2020-06-06 NOTE — Progress Notes (Signed)
Medical history reviewed with no changes since P.V. VS assessed by J.K, RN

## 2020-06-06 NOTE — Patient Instructions (Signed)
YOU HAD AN ENDOSCOPIC PROCEDURE TODAY AT THE [facility]:   Refer to the procedure report that was given to you for any specific questions about what was found during the examination.  If the procedure report does not answer your questions, please call your gastroenterologist to clarify.  If you requested that your care partner not be given the details of your procedure findings, then the procedure report has been included in a sealed envelope for you to review at your convenience later.  YOU SHOULD EXPECT: Some feelings of bloating in the abdomen. Passage of more gas than usual.  Walking can help get rid of the air that was put into your GI tract during the procedure and reduce the bloating. If you had a lower endoscopy (such as a colonoscopy or flexible sigmoidoscopy) you may notice spotting of blood in your stool or on the toilet paper. If you underwent a bowel prep for your procedure, you may not have a normal bowel movement for a few days.  Please Note:  You might notice some irritation and congestion in your nose or some drainage.  This is from the oxygen used during your procedure.  There is no need for concern and it should clear up in a day or so.  SYMPTOMS TO REPORT IMMEDIATELY:   Following lower endoscopy (colonoscopy or flexible sigmoidoscopy):  Excessive amounts of blood in the stool  Significant tenderness or worsening of abdominal pains  Swelling of the abdomen that is new, acute  Fever of 100F or higher  For urgent or emergent issues, a gastroenterologist can be reached at any hour by calling [phone]. Do not use MyChart messaging for urgent concerns.    DIET:  We do recommend a small meal at first, but then you may proceed to your regular diet.  Drink plenty of fluids but you should avoid alcoholic beverages for 24 hours.  ACTIVITY:  You should plan to take it easy for the rest of today and you should NOT DRIVE or use heavy machinery until tomorrow (because  of the sedation medicines used during the test).    FOLLOW UP: Our staff will call the number listed on your records 48-72 hours following your procedure to check on you and address any questions or concerns that you may have regarding the information given to you following your procedure. If we do not reach you, we will leave a message.  We will attempt to reach you two times.  During this call, we will ask if you have developed any symptoms of COVID 19. If you develop any symptoms (ie: fever, flu-like symptoms, shortness of breath, cough etc.) before then, please call [phone].  If you test positive for Covid 19 in the 2 weeks post procedure, please call and report this information to us.    If any biopsies were taken you will be contacted by phone or by letter within the next 1-3 weeks.  Please call us at [phone] if you have not heard about the biopsies in 3 weeks.    SIGNATURES/CONFIDENTIALITY: You and/or your care partner have signed paperwork which will be entered into your electronic medical record.  These signatures attest to the fact that that the information above on your After Visit Summary has been reviewed and is understood.  Full responsibility of the confidentiality of this discharge information lies with you and/or your care-partner. 

## 2020-06-06 NOTE — Op Note (Signed)
[facility] Patient Name: Robert Hendrix Procedure Date: 06/06/2020 11:18 AM MRN: [id number] Endoscopist: Remo Lipps P. Havery Moros , MD Age: 65 Referring MD:  Date of Birth: [birth year] Gender: Male Account #: [id number] Procedure:                Colonoscopy Indications:              High risk colon cancer surveillance: Personal                            history of colonic polyps (3 polyps removed in                            12/2016 - adenoma and sessile serrated polyps) Medicines:                Monitored Anesthesia Care Procedure:                Pre-Anesthesia Assessment:                           - Prior to the procedure, a History and Physical                            was performed, and patient medications and                            allergies were reviewed. The patient's tolerance of                            previous anesthesia was also reviewed. The risks                            and benefits of the procedure and the sedation                            options and risks were discussed with the patient.                            All questions were answered, and informed consent                            was obtained. Prior Anticoagulants: The patient has                            taken no previous anticoagulant or antiplatelet                            agents. ASA Grade Assessment: II - A patient with                            mild systemic disease. After reviewing the risks                            and benefits, the patient was deemed in  satisfactory condition to undergo the procedure.                           After obtaining informed consent, the colonoscope                            was passed under direct vision. Throughout the                            procedure, the patient's blood pressure, pulse, and                            oxygen saturations were monitored continuously. The                            Olympus CF-HQ190L  (Serial# [id number]) Colonoscope was                            introduced through the anus and advanced to the the                            cecum, identified by appendiceal orifice and                            ileocecal valve. The colonoscopy was performed                            without difficulty. The patient tolerated the                            procedure well. The quality of the bowel                            preparation was adequate. The ileocecal valve,                            appendiceal orifice, and rectum were photographed. Scope In: 11:20:01 AM Scope Out: 11:39:25 AM Scope Withdrawal Time: 0 hours 15 minutes 38 seconds  Total Procedure Duration: 0 hours 19 minutes 24 seconds  Findings:                 The perianal and digital rectal examinations were                            normal.                           A diminutive polyp was found in the ascending                            colon. The polyp was sessile. The polyp was removed                            with a cold snare. Resection and retrieval were  complete.                           A 3 mm polyp was found in the transverse colon. The                            polyp was sessile. The polyp was removed with a                            cold snare. Resection and retrieval were complete.                           Multiple small-mouthed diverticula were found in                            the sigmoid colon.                           Internal hemorrhoids were found during retroflexion.                           The exam was otherwise without abnormality. Complications:            No immediate complications. Estimated blood loss:                            Minimal. Estimated Blood Loss:     Estimated blood loss was minimal. Impression:               - One diminutive polyp in the ascending colon,                            removed with a cold snare. Resected and retrieved.                            - One 3 mm polyp in the transverse colon, removed                            with a cold snare. Resected and retrieved.                           - Diverticulosis in the sigmoid colon.                           - Internal hemorrhoids.                           - The examination was otherwise normal. Recommendation:           - Patient has a contact number available for                            emergencies. The signs and symptoms of potential                            delayed complications were discussed with the  patient. Return to normal activities tomorrow.                            Written discharge instructions were provided to the                            patient.                           - Resume previous diet.                           - Continue present medications.                           - Await pathology results. Remo Lipps P. Von Quintanar, MD 06/06/2020 11:46:14 AM This report has been signed electronically.

## 2020-06-06 NOTE — Progress Notes (Signed)
Called to room to assist during endoscopic procedure.  Patient ID and intended procedure confirmed with present staff. Received instructions for my participation in the procedure from the performing physician.  

## 2020-06-07 MED FILL — ATORVASTATIN CALCIUM 10 MG: 10 | 30 days supply | Qty: 30 | Fill #2

## 2020-06-08 ENCOUNTER — Telehealth: Payer: Self-pay | Admitting: *Deleted

## 2020-06-08 NOTE — Telephone Encounter (Signed)
  Follow up Call-  Call back number 06/06/2020  Post procedure Call Back phone  # [phone]  Permission to leave phone message Yes  Some recent data might be hidden     Patient questions:  Do you have a fever, pain , or abdominal swelling? No. Pain Score  0 *  Have you tolerated food without any problems? Yes.    Have you been able to return to your normal activities? Yes.    Do you have any questions about your discharge instructions: Diet   No. Medications  No. Follow up visit  No.  Do you have questions or concerns about your Care? No.  Actions: * If pain score is 4 or above: No action needed, pain <4.  1. Have you developed a fever since your procedure? no  2.   Have you had an respiratory symptoms (SOB or cough) since your procedure? no  3.   Have you tested positive for COVID 19 since your procedure no  4.   Have you had any family members/close contacts diagnosed with the COVID 19 since your procedure?  no   If yes to any of these questions please route to Joylene John, RN and Joella Prince, RN

## 2020-06-13 MED FILL — VALACYCLOVIR HCL 500 MG TAB: 500 | 30 days supply | Qty: 30 | Fill #3

## 2020-06-13 MED FILL — VENLAFAXINE HCL ER 150 MG C: 150 | 30 days supply | Qty: 30 | Fill #3

## 2020-06-20 ENCOUNTER — Other Ambulatory Visit (HOSPITAL_COMMUNITY): Payer: Self-pay | Admitting: Internal Medicine

## 2020-07-08 MED FILL — NYSTATIN-TRIAMCINOLONE CRM: 100000-0.1 | 15 days supply | Qty: 30 | Fill #0

## 2020-07-12 MED FILL — VENLAFAXINE HCL ER 150 MG C: 150 | 30 days supply | Qty: 30 | Fill #4

## 2020-07-12 MED FILL — VALACYCLOVIR HCL 500 MG TAB: 500 | 30 days supply | Qty: 30 | Fill #4

## 2020-08-10 ENCOUNTER — Other Ambulatory Visit (HOSPITAL_COMMUNITY): Payer: Self-pay | Admitting: *Deleted

## 2020-08-15 ENCOUNTER — Other Ambulatory Visit (HOSPITAL_COMMUNITY): Payer: Self-pay

## 2020-08-15 MED FILL — Venlafaxine HCl Cap ER 24HR 150 MG (Base Equivalent): ORAL | 30 days supply | Qty: 30 | Fill #0 | Status: AC

## 2020-08-15 MED FILL — Atorvastatin Calcium Tab 10 MG (Base Equivalent): ORAL | 30 days supply | Qty: 30 | Fill #0 | Status: AC

## 2020-08-15 MED FILL — Valacyclovir HCl Tab 500 MG: ORAL | 30 days supply | Qty: 30 | Fill #0 | Status: AC

## 2020-08-17 ENCOUNTER — Other Ambulatory Visit: Payer: Self-pay

## 2020-08-17 ENCOUNTER — Emergency Department (HOSPITAL_BASED_OUTPATIENT_CLINIC_OR_DEPARTMENT_OTHER): Payer: BLUE CROSS/BLUE SHIELD

## 2020-08-17 ENCOUNTER — Encounter (HOSPITAL_BASED_OUTPATIENT_CLINIC_OR_DEPARTMENT_OTHER): Payer: Self-pay | Admitting: Radiology

## 2020-08-17 ENCOUNTER — Emergency Department (HOSPITAL_BASED_OUTPATIENT_CLINIC_OR_DEPARTMENT_OTHER): Payer: BLUE CROSS/BLUE SHIELD | Admitting: Radiology

## 2020-08-17 ENCOUNTER — Ambulatory Visit (HOSPITAL_BASED_OUTPATIENT_CLINIC_OR_DEPARTMENT_OTHER): Payer: Self-pay | Attending: Cardiology

## 2020-08-17 ENCOUNTER — Emergency Department (HOSPITAL_BASED_OUTPATIENT_CLINIC_OR_DEPARTMENT_OTHER)
Admission: EM | Admit: 2020-08-17 | Discharge: 2020-08-17 | Disposition: A | Discharge status: Home / Self Care | Payer: BLUE CROSS/BLUE SHIELD | Attending: Emergency Medicine | Admitting: Emergency Medicine

## 2020-08-17 DIAGNOSIS — R0781 Pleurodynia: Secondary | ICD-10-CM | POA: Insufficient documentation

## 2020-08-17 DIAGNOSIS — J9811 Atelectasis: Secondary | ICD-10-CM | POA: Diagnosis not present

## 2020-08-17 DIAGNOSIS — R0789 Other chest pain: Secondary | ICD-10-CM | POA: Diagnosis not present

## 2020-08-17 DIAGNOSIS — Z85828 Personal history of other malignant neoplasm of skin: Secondary | ICD-10-CM | POA: Insufficient documentation

## 2020-08-17 DIAGNOSIS — R079 Chest pain, unspecified: Secondary | ICD-10-CM | POA: Diagnosis not present

## 2020-08-17 DIAGNOSIS — F17290 Nicotine dependence, other tobacco product, uncomplicated: Secondary | ICD-10-CM | POA: Diagnosis not present

## 2020-08-17 DIAGNOSIS — M549 Dorsalgia, unspecified: Secondary | ICD-10-CM

## 2020-08-17 DIAGNOSIS — M546 Pain in thoracic spine: Secondary | ICD-10-CM | POA: Diagnosis not present

## 2020-08-17 DIAGNOSIS — R0602 Shortness of breath: Secondary | ICD-10-CM | POA: Diagnosis not present

## 2020-08-17 LAB — BASIC METABOLIC PANEL
Anion gap: 8 (ref 5–15)
BUN: 24 mg/dL — ABNORMAL HIGH (ref 8–23)
CO2: 23 mmol/L (ref 22–32)
Calcium: 9.4 mg/dL (ref 8.9–10.3)
Chloride: 107 mmol/L (ref 98–111)
Creatinine, Ser: 1.02 mg/dL (ref 0.61–1.24)
GFR, Estimated: >60 mL/min (ref >60)
Glucose, Bld: 81 mg/dL (ref 70–99)
Potassium: 4.3 mmol/L (ref 3.5–5.1)
Sodium: 138 mmol/L (ref 135–145)

## 2020-08-17 LAB — CBC WITH DIFFERENTIAL/PLATELET
Abs Immature Granulocytes: 0.02 10*3/uL (ref 0.00–0.07)
Basophils Absolute: 0.1 10*3/uL (ref 0.0–0.1)
Basophils Relative: 1 %
Eosinophils Absolute: 0.2 10*3/uL (ref 0.0–0.5)
Eosinophils Relative: 3 %
HCT: 44.4 % (ref 39.0–52.0)
Hemoglobin: 14.5 g/dL (ref 13.0–17.0)
Immature Granulocytes: 0 %
Lymphocytes Relative: 30 %
Lymphs Abs: 1.8 10*3/uL (ref 0.7–4.0)
MCH: 27.7 pg (ref 26.0–34.0)
MCHC: 32.7 g/dL (ref 30.0–36.0)
MCV: 84.9 fL (ref 80.0–100.0)
Monocytes Absolute: 0.5 10*3/uL (ref 0.1–1.0)
Monocytes Relative: 9 %
Neutro Abs: 3.3 10*3/uL (ref 1.7–7.7)
Neutrophils Relative %: 57 %
Platelets: 179 10*3/uL (ref 150–400)
RBC: 5.23 MIL/uL (ref 4.22–5.81)
RDW: 15 % (ref 11.5–15.5)
WBC: 5.8 10*3/uL (ref 4.0–10.5)
nRBC: 0 % (ref 0.0–0.2)

## 2020-08-17 LAB — D-DIMER, QUANTITATIVE: D-Dimer, Quant: 0.45 ug/mL-FEU (ref 0.00–0.50)

## 2020-08-17 LAB — TROPONIN I (HIGH SENSITIVITY)
Troponin I (High Sensitivity): 7 ng/L (ref <18)
Troponin I (High Sensitivity): 7 ng/L (ref <18)

## 2020-08-17 MED ORDER — IOHEXOL 350 MG/ML SOLN
100.0000 mL | Freq: Once | INTRAVENOUS | Status: AC | PRN
  Administered 2020-08-17: 100 mL via INTRAVENOUS

## 2020-08-17 NOTE — ED Provider Notes (Signed)
[facility] EMERGENCY DEPT Provider Note   CSN: [id number] Arrival date & time: 08/17/20  1522     History Chief Complaint  Patient presents with  . Back Pain    Robert Hendrix is a 65 y.o. male.  Patient presents ER chief complaint of upper back pain.  Describes sharp and persistent in the left side upper back.  He denies any recent fall or trauma.  He states that it started yesterday has been persistent through the night and all today.  Nothing seems to make it better.  Is worse when he moves a certain way.  He has a history of a rib fracture in that region several months ago that has healed without any complications or problems.  Denies fevers or cough.  No vomiting or diarrhea.        Past Medical History:  Diagnosis Date  . Alcohol abuse 2002  . Cancer ([place])    basal cell carcinoma of left chest   . Chest pain 2017  . Drug abuse ([place]) 2002  . Hip pain   . Hyperglycemia   . Hyperlipidemia    on meds  . Low back pain   . Major depression    hx of  . Nasal fracture 1980  . Neck pain   . OSA on CPAP   . Osteoarthritis of thumb   . Plantar fasciitis   . Prostatitis   . Psoriasis   . Sleep apnea    OSA uses CPAP-  . Thrombosis 2018   LEFT superficial vein thromobsis  . Ulnar neuropathy at elbow of left upper extremity 05/04/2019   numbess of fingers    Patient Active Problem List   Diagnosis Date Noted  . Ulnar neuropathy at elbow of left upper extremity 05/04/2019  . OSA on CPAP 02/23/2019    Past Surgical History:  Procedure Laterality Date  . COLONOSCOPY  2018   SA-MAC-suprep)ade)-polyps  . I & D EXTREMITY N/A 09/21/2017   Procedure: IRRIGATION AND DEBRIDEMENT EXTREMITY;  Surgeon: [name], MD;  Location: [facility];  Service: Orthopedics;  Laterality: N/A;  . NOSE SURGERY  1978   Nasal fracture   . POLYPECTOMY  2018   polyps  . PROSTATE BIOPSY  1990  . WISDOM TOOTH EXTRACTION  1990       Family History  Adopted: Yes  Problem  Relation Age of Onset  . Rectal cancer Neg Hx   . Prostate cancer Neg Hx   . Pancreatic cancer Neg Hx   . Esophageal cancer Neg Hx   . Stomach cancer Neg Hx     Social History   Tobacco Use  . Smoking status: Light Tobacco Smoker    Types: Cigars  . Smokeless tobacco: Never Used  . Tobacco comment: 1 cigar daily  Vaping Use  . Vaping Use: Never used  Substance Use Topics  . Alcohol use: No  . Drug use: No    Home Medications Prior to Admission medications   Medication Sig Start Date End Date Taking? Authorizing Provider  atorvastatin (LIPITOR) 10 MG tablet TAKE 1 TABLET BY MOUTH ONCE DAILY 03/10/20 03/10/21  Shon Baton, MD  cholecalciferol (VITAMIN D) 1000 units tablet Take 1,000 Units by mouth daily.    [provider]  fluconazole (DIFLUCAN) 100 MG tablet TAKE 1 TABLET BY MOUTH EVERY OTHER DAY FOR 8 DOSES 03/14/20 03/14/21  Shon Baton, MD  glucosamine-chondroitin 500-400 MG tablet Take 1 tablet by mouth daily.    [provider]  ibuprofen (ADVIL,MOTRIN) 800 MG tablet Take 800 mg by mouth 3 (three) times daily as needed for moderate pain.  Patient not taking: Reported on 06/06/2020    [provider]  meloxicam (MOBIC) 15 MG tablet daily as needed. Patient not taking: Reported on 06/06/2020    [provider]  Multiple Vitamins-Minerals (MULTIVITAMIN ADULTS PO) Take 1 tablet by mouth daily.    [provider]  niacin (SLO-NIACIN) 500 MG tablet Take 500 mg by mouth daily.    [provider]  nystatin-triamcinolone (MYCOLOG II) cream APPLY TOPICALLY TO AFFECTED AREA TWICE DAILY AS NEEDED. 06/20/20 06/20/21  Shon Baton, MD  valACYclovir (VALTREX) 500 MG tablet Take 500 mg by mouth daily. 08/25/17   [provider]  valACYclovir (VALTREX) 500 MG tablet TAKE 1 TABLET BY MOUTH ONCE A DAY 03/10/20 03/10/21  Shon Baton, MD  venlafaxine XR (EFFEXOR-XR) 150 MG 24 hr capsule Take 150 mg by mouth daily with breakfast.    [provider]  venlafaxine XR (EFFEXOR-XR) 150 MG 24 hr capsule TAKE 1 CAPSULE BY MOUTH ONCE A DAY 03/10/20 03/10/21  Shon Baton, MD  zinc gluconate 50 MG tablet Take 50 mg by mouth daily.    [provider]    Allergies    Patient has no known allergies.  Review of Systems   Review of Systems  Constitutional: Negative for fever.  HENT: Negative for ear pain and sore throat.   Eyes: Negative for pain.  Respiratory: Negative for cough.   Cardiovascular: Negative for chest pain.  Gastrointestinal: Negative for abdominal pain.  Genitourinary: Negative for flank pain.  Musculoskeletal: Positive for back pain.  Skin: Negative for color change and rash.  Neurological: Negative for syncope.  All other systems reviewed and are negative.   Physical Exam Updated Vital Signs BP (!) 141/89   Pulse (!) 38   Temp 98 F (36.7 C) (Oral)   Resp 12   Ht 6' (1.829 m)   Wt 99.8 kg   SpO2 93%   BMI 29.84 kg/m   Physical Exam Constitutional:      General: He is not in acute distress.    Appearance: He is well-developed.  HENT:     Head: Normocephalic.     Nose: Nose normal.  Eyes:     Extraocular Movements: Extraocular movements intact.  Cardiovascular:     Rate and Rhythm: Normal rate.  Pulmonary:     Effort: Pulmonary effort is normal.  Musculoskeletal:     Comments: No C or T or L-spine midline step-offs or tenderness.  No upper back tenderness on palpation elicited.  Skin:    Coloration: Skin is not jaundiced.  Neurological:     General: No focal deficit present.     Mental Status: He is alert and oriented to person, place, and time. Mental status is at baseline.     Cranial Nerves: No cranial nerve deficit.     Motor: No weakness.     Gait: Gait normal.     ED Results / Procedures / Treatments   Labs (all labs ordered are listed, but only abnormal results are displayed) Labs Reviewed  BASIC METABOLIC PANEL - Abnormal; Notable for the following components:       Result Value   BUN 24 (*)    All other components within normal limits  CBC WITH DIFFERENTIAL/PLATELET  D-DIMER, QUANTITATIVE  TROPONIN I (HIGH SENSITIVITY)  TROPONIN I (HIGH SENSITIVITY)    EKG None  Radiology DG  Chest 2 View  Result Date: 08/17/2020 CLINICAL DATA:  Left chest pain EXAM: CHEST - 2 VIEW COMPARISON:  None. FINDINGS: The heart size and mediastinal contours are within normal limits. Both lungs are clear. The visualized skeletal structures are unremarkable. IMPRESSION: No active cardiopulmonary disease. Electronically Signed   By: Prudencio Pair M.D.   On: 08/17/2020 16:33   CT Angio Chest Aorta w/CM &/OR wo/CM  Result Date: 08/17/2020 CLINICAL DATA:  after wearing a boot that was too small. h right midthoracic back pain/rib pain. Patient denies SOB, N/V/D, or chest pain. EXAM: CT ANGIOGRAPHY CHEST WITH CONTRAST TECHNIQUE: Multidetector CT imaging of the chest was performed using the standard protocol during bolus administration of intravenous contrast. Multiplanar CT image reconstructions and MIPs were obtained to evaluate the vascular anatomy. CONTRAST:  112m OMNIPAQUE IOHEXOL 350 MG/ML SOLN COMPARISON:  None. FINDINGS: Cardiovascular: Satisfactory opacification of the pulmonary arteries to the segmental level. No evidence of pulmonary embolism. Normal heart size. No significant pericardial effusion. The thoracic aorta is normal in caliber. No dissection. No atherosclerotic plaque of the thoracic aorta. No coronary artery calcifications. Mediastinum/Nodes: No enlarged mediastinal, hilar, or axillary lymph nodes. Thyroid gland, trachea, and esophagus demonstrate no significant findings. Lungs/Pleura: Bilateral lower lobe subsegmental atelectasis. No focal consolidation. Several scattered calcified pulmonary micronodules. No pulmonary mass. No pleural effusion. No pneumothorax. Upper Abdomen: Incidentally noted replaced right hepatic artery originating off the superior mesenteric  artery. Splenule noted. No acute abnormality. Musculoskeletal: No chest wall abnormality. No suspicious lytic or blastic osseous lesions. No acute displaced fracture. Review of the MIP images confirms the above findings. IMPRESSION: 1. No pulmonary embolus. 2. No aortic aneurysm or dissection. 3. No acute intrathoracic abnormality. Electronically Signed   By: MIven FinnM.D.   On: 08/17/2020 18:14    Procedures Procedures   Medications Ordered in ED Medications  iohexol (OMNIPAQUE) 350 MG/ML injection 100 mL (100 mLs Intravenous Contrast Given 08/17/20 1742)    ED Course  I have reviewed the triage vital signs and the nursing notes.  Pertinent labs & imaging results that were available during my care of the patient were reviewed by me and considered in my medical decision making (see chart for details).    MDM Rules/Calculators/A&P                          Patient is able to move his back twist his torso without eliciting his pain.  He states that the persistent pain that he cannot feel still at rest.  Given lack of trauma with upper back pain in a 65year old male, additional imaging and testing pursued.  D-dimer was negative for pulmonary embolism making it unlikely.  CT angio pursued for aortic etiology which is unremarkable as well.  I will advise close outpatient follow with his doctors in 2 or 3 days.  Advising me return for worsening pain or any additional concerns.  Final Clinical Impression(s) / ED Diagnoses Final diagnoses:  Upper back pain    Rx / DC Orders ED Discharge Orders    None       HLuna Fuse MD 08/17/20 [phone]

## 2020-08-17 NOTE — ED Triage Notes (Signed)
Patient presents to the ER with right midthoracic back pain/rib pain. Patient denies SOB, N/V/D, or chest pain.

## 2020-08-17 NOTE — ED Notes (Signed)
Patient transported to CT 

## 2020-08-17 NOTE — Discharge Instructions (Signed)
Call your primary care doctor or specialist as discussed in the next 2-3 days.   Return immediately back to the ER if:  Your symptoms worsen within the next 12-24 hours. You develop new symptoms such as new fevers, persistent vomiting, new pain, shortness of breath, or new weakness or numbness, or if you have any other concerns.  

## 2020-08-30 ENCOUNTER — Ambulatory Visit: Payer: Self-pay

## 2020-08-30 ENCOUNTER — Encounter: Payer: Self-pay | Admitting: Orthopaedic Surgery

## 2020-08-30 ENCOUNTER — Ambulatory Visit (INDEPENDENT_AMBULATORY_CARE_PROVIDER_SITE_OTHER): Payer: BC Managed Care – PPO | Admitting: Orthopaedic Surgery

## 2020-08-30 ENCOUNTER — Telehealth: Payer: Self-pay

## 2020-08-30 VITALS — BP 161/84 | HR 60 | Ht 72.0 in | Wt 226.0 lb

## 2020-08-30 DIAGNOSIS — M549 Dorsalgia, unspecified: Secondary | ICD-10-CM | POA: Diagnosis not present

## 2020-08-30 NOTE — Telephone Encounter (Signed)
Patient called he is requesting to be worked into [employer] scheduled regarding hid back,he stated he has already spoke to Dr.Yates call back:[phone]

## 2020-08-30 NOTE — Telephone Encounter (Signed)
Tried calling patient to work him in.  No answer. LMVM advising we can work him in to see Dr [name] this afternoon or Friday afternoon at 1pm

## 2020-08-31 NOTE — Progress Notes (Signed)
Office Visit Note   Patient: Robert Hendrix           Date of Birth: 05-Jan-1956           MRN: [id number] Visit Date: 08/30/2020              Requested by: Shon Baton, [address] [place],  [place] [address] PCP: Shon Baton, MD   Assessment & Plan: Visit Diagnoses:  1. Mid back pain     Plan: Discussed physical therapy can call if he like to proceed.  He can try some Aspercreme or Voltaren gel.  He is used Tylenol also taken Motrin.  Stretching discussed.  If he develops increased symptoms he will let us know.  Angio CT aorta results chest x-ray films were reviewed from 4 /6/22 with patient today.  Follow-Up Instructions: No follow-ups on file.   Orders:  Orders Placed This Encounter  Procedures  . XR Lumbar Spine 2-3 Views  . XR Cervical Spine 2 or 3 views   No orders of the defined types were placed in this encounter.     Procedures: No procedures performed   Clinical Data: No additional findings.   Subjective: Chief Complaint  Patient presents with  . 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  He was seen in the emergency room on 08/17/2020 had chest CT done which was negative.  He has had left superficial vein thrombosis in the past ulnar neuropathy surgery cervical fusion multilevel a few years ago.  No shortness of breath. ED visit was 08/17/2020.  D-dimer was negative troponin was normal. Review of Systems all other systems noncontributory to HPI.   Objective: Vital Signs: BP (!) 161/84   Pulse 60   Ht 6' (1.829 m)   Wt 226 lb (102.5 kg)   BMI 30.65 kg/m   Physical Exam Constitutional:      Appearance: He is well-developed.  HENT:     Head: Normocephalic and atraumatic.  Eyes:     Pupils: Pupils are equal, round, and reactive to light.  Neck:     Thyroid: No thyromegaly.     Trachea: No tracheal deviation.  Cardiovascular:     Rate and Rhythm: Normal rate.   Pulmonary:     Effort: Pulmonary effort is normal.     Breath sounds: No wheezing.  Abdominal:     General: Bowel sounds are normal.     Palpations: Abdomen is soft.  Skin:    General: Skin is warm and dry.     Capillary Refill: Capillary refill takes less than 2 seconds.  Neurological:     Mental Status: He is alert and oriented to person, place, and time.  Psychiatric:        Behavior: Behavior normal.        Thought Content: Thought content normal.        Judgment: Judgment normal.     Ortho Exam patient can get his arm up overhead.  Negative impingement.  No brachial plexus tenderness.  Negative Lhermitte.  Specialty Comments:  No specialty comments available.  Imaging: No results found.   PMFS History: Patient Active Problem List   Diagnosis Date Noted  . Ulnar neuropathy at elbow of left upper extremity 05/04/2019  . OSA on CPAP 02/23/2019   Past Medical History:  Diagnosis Date  . Alcohol abuse 2002  . Cancer ([place])  basal cell carcinoma of left chest   . Chest pain 2017  . Drug abuse ([place]) 2002  . Hip pain   . Hyperglycemia   . Hyperlipidemia    on meds  . Low back pain   . Major depression    hx of  . Nasal fracture 1980  . Neck pain   . OSA on CPAP   . Osteoarthritis of thumb   . Plantar fasciitis   . Prostatitis   . Psoriasis   . Sleep apnea    OSA uses CPAP-  . Thrombosis 2018   LEFT superficial vein thromobsis  . Ulnar neuropathy at elbow of left upper extremity 05/04/2019   numbess of fingers    Family History  Adopted: Yes  Problem Relation Age of Onset  . Rectal cancer Neg Hx   . Prostate cancer Neg Hx   . Pancreatic cancer Neg Hx   . Esophageal cancer Neg Hx   . Stomach cancer Neg Hx     Past Surgical History:  Procedure Laterality Date  . COLONOSCOPY  2018   SA-MAC-suprep)ade)-polyps  . I & D EXTREMITY N/A 09/21/2017   Procedure: IRRIGATION AND DEBRIDEMENT EXTREMITY;  Surgeon: [name], MD;  Location: [facility];   Service: Orthopedics;  Laterality: N/A;  . NOSE SURGERY  1978   Nasal fracture   . POLYPECTOMY  2018   polyps  . PROSTATE BIOPSY  1990  . [place] EXTRACTION  1990   Social History   Occupational History  . Not on file  Tobacco Use  . Smoking status: Light Tobacco Smoker    Types: Cigars  . Smokeless tobacco: Never Used  . Tobacco comment: 1 cigar daily  Vaping Use  . Vaping Use: Never used  Substance and Sexual Activity  . Alcohol use: No  . Drug use: No  . Sexual activity: Not on file

## 2020-09-07 ENCOUNTER — Ambulatory Visit (HOSPITAL_BASED_OUTPATIENT_CLINIC_OR_DEPARTMENT_OTHER)
Admission: RE | Admit: 2020-09-07 | Discharge: 2020-09-07 | Disposition: A | Discharge status: Home / Self Care | Payer: BC Managed Care – PPO | Source: Ambulatory Visit | Attending: Cardiology | Admitting: Cardiology

## 2020-09-07 ENCOUNTER — Other Ambulatory Visit: Payer: Self-pay

## 2020-09-08 ENCOUNTER — Ambulatory Visit (HOSPITAL_BASED_OUTPATIENT_CLINIC_OR_DEPARTMENT_OTHER): Payer: BLUE CROSS/BLUE SHIELD

## 2020-09-11 ENCOUNTER — Other Ambulatory Visit (HOSPITAL_COMMUNITY): Payer: Self-pay | Admitting: Internal Medicine

## 2020-09-13 ENCOUNTER — Other Ambulatory Visit (HOSPITAL_COMMUNITY): Payer: Self-pay

## 2020-09-13 ENCOUNTER — Other Ambulatory Visit (HOSPITAL_COMMUNITY): Payer: Self-pay | Admitting: Internal Medicine

## 2020-09-14 ENCOUNTER — Other Ambulatory Visit (HOSPITAL_COMMUNITY): Payer: Self-pay

## 2020-09-14 MED ORDER — VALACYCLOVIR HCL 500 MG PO TABS
500.0000 mg | ORAL_TABLET | Freq: Every day | ORAL | 3 refills | Status: DC
  Filled 2020-09-14: qty 90, 90d supply, fill #0
  Filled 2020-12-05: qty 90, 90d supply, fill #1
  Filled 2021-03-12: qty 90, 90d supply, fill #2
  Filled 2021-06-05: qty 90, 90d supply, fill #3

## 2020-09-14 MED ORDER — VENLAFAXINE HCL ER 150 MG PO CP24
150.0000 mg | ORAL_CAPSULE | Freq: Every day | ORAL | 3 refills | Status: DC
  Filled 2020-09-14: qty 90, 90d supply, fill #0
  Filled 2020-12-05: qty 90, 90d supply, fill #1
  Filled 2021-03-12: qty 90, 90d supply, fill #2
  Filled 2021-06-05: qty 90, 90d supply, fill #3

## 2020-09-25 MED FILL — Atorvastatin Calcium Tab 10 MG (Base Equivalent): ORAL | 30 days supply | Qty: 30 | Fill #1 | Status: AC

## 2020-09-26 ENCOUNTER — Other Ambulatory Visit (HOSPITAL_COMMUNITY): Payer: Self-pay

## 2020-10-27 ENCOUNTER — Other Ambulatory Visit (HOSPITAL_COMMUNITY): Payer: Self-pay

## 2020-10-27 DIAGNOSIS — E785 Hyperlipidemia, unspecified: Secondary | ICD-10-CM | POA: Diagnosis not present

## 2020-10-27 MED ORDER — REPATHA SURECLICK 140 MG/ML ~~LOC~~ SOAJ
SUBCUTANEOUS | 6 refills | Status: DC
  Filled 2020-10-27 – 2020-10-28 (×3): qty 2, 28d supply, fill #0
  Filled 2020-12-19: qty 2, 28d supply, fill #1
  Filled 2021-01-09: qty 2, 28d supply, fill #2
  Filled 2021-02-13: qty 2, 28d supply, fill #3
  Filled 2021-03-13: qty 2, 28d supply, fill #4
  Filled 2021-04-02: qty 2, 28d supply, fill #5
  Filled 2021-05-09: qty 2, 28d supply, fill #6

## 2020-10-28 ENCOUNTER — Other Ambulatory Visit (HOSPITAL_COMMUNITY): Payer: Self-pay

## 2020-11-28 ENCOUNTER — Other Ambulatory Visit (HOSPITAL_COMMUNITY): Payer: Self-pay

## 2020-11-28 MED ORDER — PREDNISONE 10 MG PO TABS
ORAL_TABLET | ORAL | 0 refills | Status: AC
  Filled 2020-11-28: qty 11, 6d supply, fill #0

## 2020-11-28 MED ORDER — FLUCONAZOLE 100 MG PO TABS
100.0000 mg | ORAL_TABLET | ORAL | 1 refills | Status: DC
  Filled 2020-11-28: qty 8, 30d supply, fill #0
  Filled 2021-08-23: qty 8, 30d supply, fill #1

## 2020-11-29 ENCOUNTER — Other Ambulatory Visit (HOSPITAL_COMMUNITY): Payer: Self-pay

## 2020-12-02 ENCOUNTER — Other Ambulatory Visit (HOSPITAL_COMMUNITY): Payer: Self-pay

## 2020-12-02 DIAGNOSIS — Z125 Encounter for screening for malignant neoplasm of prostate: Secondary | ICD-10-CM | POA: Diagnosis not present

## 2020-12-02 DIAGNOSIS — A630 Anogenital (venereal) warts: Secondary | ICD-10-CM | POA: Diagnosis not present

## 2020-12-02 MED ORDER — PODOFILOX 0.5 % EX SOLN
CUTANEOUS | 0 refills | Status: DC
  Filled 2020-12-02: qty 3.5, 30d supply, fill #0
  Filled 2020-12-02: qty 3.5, 28d supply, fill #0

## 2020-12-05 ENCOUNTER — Other Ambulatory Visit (HOSPITAL_COMMUNITY): Payer: Self-pay

## 2020-12-05 DIAGNOSIS — L02212 Cutaneous abscess of back [any part, except buttock]: Secondary | ICD-10-CM | POA: Diagnosis not present

## 2020-12-05 MED ORDER — DOXYCYCLINE HYCLATE 100 MG PO TABS
100.0000 mg | ORAL_TABLET | Freq: Two times a day (BID) | ORAL | 0 refills | Status: DC
  Filled 2020-12-05: qty 20, 10d supply, fill #0

## 2020-12-06 DIAGNOSIS — L723 Sebaceous cyst: Secondary | ICD-10-CM | POA: Diagnosis not present

## 2020-12-06 DIAGNOSIS — L089 Local infection of the skin and subcutaneous tissue, unspecified: Secondary | ICD-10-CM | POA: Diagnosis not present

## 2020-12-17 DIAGNOSIS — M25552 Pain in left hip: Secondary | ICD-10-CM | POA: Diagnosis not present

## 2020-12-19 ENCOUNTER — Other Ambulatory Visit (HOSPITAL_COMMUNITY): Payer: Self-pay

## 2020-12-26 DIAGNOSIS — S76312D Strain of muscle, fascia and tendon of the posterior muscle group at thigh level, left thigh, subsequent encounter: Secondary | ICD-10-CM | POA: Diagnosis not present

## 2020-12-26 DIAGNOSIS — M6281 Muscle weakness (generalized): Secondary | ICD-10-CM | POA: Diagnosis not present

## 2020-12-26 DIAGNOSIS — M79652 Pain in left thigh: Secondary | ICD-10-CM | POA: Diagnosis not present

## 2020-12-27 DIAGNOSIS — R29898 Other symptoms and signs involving the musculoskeletal system: Secondary | ICD-10-CM | POA: Diagnosis not present

## 2021-01-04 DIAGNOSIS — M6281 Muscle weakness (generalized): Secondary | ICD-10-CM | POA: Diagnosis not present

## 2021-01-04 DIAGNOSIS — S76312D Strain of muscle, fascia and tendon of the posterior muscle group at thigh level, left thigh, subsequent encounter: Secondary | ICD-10-CM | POA: Diagnosis not present

## 2021-01-04 DIAGNOSIS — M79652 Pain in left thigh: Secondary | ICD-10-CM | POA: Diagnosis not present

## 2021-01-09 ENCOUNTER — Other Ambulatory Visit (HOSPITAL_COMMUNITY): Payer: Self-pay

## 2021-01-12 ENCOUNTER — Telehealth: Payer: BC Managed Care – PPO | Admitting: Physician Assistant

## 2021-01-12 ENCOUNTER — Other Ambulatory Visit (HOSPITAL_COMMUNITY): Payer: Self-pay

## 2021-01-12 DIAGNOSIS — M79652 Pain in left thigh: Secondary | ICD-10-CM | POA: Diagnosis not present

## 2021-01-12 DIAGNOSIS — M6281 Muscle weakness (generalized): Secondary | ICD-10-CM | POA: Diagnosis not present

## 2021-01-12 DIAGNOSIS — H01002 Unspecified blepharitis right lower eyelid: Secondary | ICD-10-CM

## 2021-01-12 DIAGNOSIS — S76312D Strain of muscle, fascia and tendon of the posterior muscle group at thigh level, left thigh, subsequent encounter: Secondary | ICD-10-CM | POA: Diagnosis not present

## 2021-01-12 MED ORDER — POLYMYXIN B-TRIMETHOPRIM 10000-0.1 UNIT/ML-% OP SOLN
1.0000 [drp] | OPHTHALMIC | 0 refills | Status: DC
  Filled 2021-01-12: qty 10, 30d supply, fill #0

## 2021-01-12 NOTE — Progress Notes (Signed)
E-Visit for [employer]   We are sorry that you are not feeling well.  Here is how we plan to help!  Based on what you have shared with me it looks like you have conjunctivitis.  Conjunctivitis is a common inflammatory or infectious condition of the eye that is often referred to as "pink eye".  In most cases it is contagious (viral or bacterial). However, not all conjunctivitis requires antibiotics (ex. Allergic).  We have made appropriate suggestions for you based upon your presentation.  I do believe, more so, by your answers that you more likely have blepharitis. This is when the oil glands in the eyelids become clogged or blocked. It normally will cause redness and swelling of the upper or lower eye lid, sometimes both.   I have prescribed Polytrim Ophthalmic drops 1-2 drops 4 times a day times 5 days  Also, washing the eyelids with a shampoo that does not hurt eyes, like [employer] shampoo helps to clear the oil glands of any blockages as well.   Pink eye can be highly contagious.  It is typically spread through direct contact with secretions, or contaminated objects or surfaces that one may have touched.  Strict handwashing is suggested with soap and water is urged.  If not available, use alcohol based had sanitizer.  Avoid unnecessary touching of the eye.  If you wear contact lenses, you will need to refrain from wearing them until you see no white discharge from the eye for at least 24 hours after being on medication.  You should see symptom improvement in 1-2 days after starting the medication regimen.  Call us if symptoms are not improved in 1-2 days.  Home Care: Wash your hands often! Do not wear your contacts until you complete your treatment plan. Avoid sharing towels, bed linen, personal items with a person who has pink eye. See attention for anyone in your home with similar symptoms.  Get Help Right Away If: Your symptoms do not improve. You develop blurred or loss of  vision. Your symptoms worsen (increased discharge, pain or redness)   Thank you for choosing an e-visit.  Your e-visit answers were reviewed by a board certified advanced clinical practitioner to complete your personal care plan. Depending upon the condition, your plan could have included both over the counter or prescription medications.  Please review your pharmacy choice. Make sure the pharmacy is open so you can pick up prescription now. If there is a problem, you may contact your provider through [employer] and have the prescription routed to another pharmacy.  Your safety is important to [place]. If you have drug allergies check your prescription carefully.   For the next 24 hours you can use MyChart to ask questions about today's visit, request a non-urgent call back, or ask for a work or school excuse. You will get an email in the next two days asking about your experience. I hope that your e-visit has been valuable and will speed your recovery.  I provided 5 minutes of non face-to-face time during this encounter for chart review and documentation.

## 2021-01-13 ENCOUNTER — Other Ambulatory Visit (HOSPITAL_COMMUNITY): Payer: Self-pay

## 2021-01-17 ENCOUNTER — Other Ambulatory Visit (HOSPITAL_COMMUNITY): Payer: Self-pay

## 2021-01-25 DIAGNOSIS — G4733 Obstructive sleep apnea (adult) (pediatric): Secondary | ICD-10-CM | POA: Diagnosis not present

## 2021-01-25 DIAGNOSIS — G4730 Sleep apnea, unspecified: Secondary | ICD-10-CM | POA: Diagnosis not present

## 2021-02-13 ENCOUNTER — Other Ambulatory Visit (HOSPITAL_COMMUNITY): Payer: Self-pay

## 2021-02-21 ENCOUNTER — Telehealth: Payer: BC Managed Care – PPO | Admitting: Emergency Medicine

## 2021-02-21 ENCOUNTER — Other Ambulatory Visit (HOSPITAL_COMMUNITY): Payer: Self-pay

## 2021-02-21 DIAGNOSIS — H00012 Hordeolum externum right lower eyelid: Secondary | ICD-10-CM | POA: Diagnosis not present

## 2021-02-21 MED ORDER — ERYTHROMYCIN 5 MG/GM OP OINT
1.0000 "application " | TOPICAL_OINTMENT | Freq: Two times a day (BID) | OPHTHALMIC | 0 refills | Status: DC
  Filled 2021-02-21: qty 3.5, 7d supply, fill #0

## 2021-02-21 NOTE — Progress Notes (Signed)
  E-Visit for [employer]   We are sorry that you are not feeling well. Here is how we plan to help!  Based on what you have shared with me it looks like you have a stye.  A stye is an inflammation of the eyelid.  It is often a red, painful lump near the edge of the eyelid that may look like a boil or a pimple.  A stye develops when an infection occurs at the base of an eyelash.   We have made appropriate suggestions for you based upon your presentation: Simple styes can be treated without medical intervention.  Most styes either resolve spontaneously or resolve with simple home treatment by applying warm compresses or heated washcloth to the stye for about 10-15 minutes three to four times a day. This causes the stye to drain and resolve.  I've also prescribed an antibiotic ointment, Romycin, which you can apply to the lower eyelid morning and evening for the next few days.  HOME CARE:  Wash your hands often! Let the stye open on its own. Don't squeeze or open it. Don't rub your eyes. This can irritate your eyes and let in bacteria.  If you need to touch your eyes, wash your hands first. Don't wear eye makeup or contact lenses until the area has healed.  GET HELP RIGHT AWAY IF:  Your symptoms do not improve. You develop blurred or loss of vision. Your symptoms worsen (increased discharge, pain or redness).   Thank you for choosing an e-visit.  Your e-visit answers were reviewed by a board certified advanced clinical practitioner to complete your personal care plan. Depending upon the condition, your plan could have included both over the counter or prescription medications.  Please review your pharmacy choice. Make sure the pharmacy is open so you can pick up prescription now. If there is a problem, you may contact your provider through [employer] and have the prescription routed to another pharmacy.  Your safety is important to [place]. If you have drug allergies check your prescription  carefully.   For the next 24 hours you can use MyChart to ask questions about today's visit, request a non-urgent call back, or ask for a work or school excuse. You will get an email in the next two days asking about your experience. I hope that your e-visit has been valuable and will speed your recovery.  Approximately 5 minutes was used in reviewing the patient's chart, questionnaire, prescribing medications, and documentation.

## 2021-02-22 ENCOUNTER — Ambulatory Visit (INDEPENDENT_AMBULATORY_CARE_PROVIDER_SITE_OTHER): Payer: BC Managed Care – PPO | Admitting: Family Medicine

## 2021-02-22 ENCOUNTER — Encounter: Payer: Self-pay | Admitting: Family Medicine

## 2021-02-22 VITALS — BP 143/83 | HR 59 | Ht 72.0 in | Wt 220.8 lb

## 2021-02-22 DIAGNOSIS — Z9989 Dependence on other enabling machines and devices: Secondary | ICD-10-CM | POA: Diagnosis not present

## 2021-02-22 DIAGNOSIS — G4733 Obstructive sleep apnea (adult) (pediatric): Secondary | ICD-10-CM | POA: Diagnosis not present
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
## 2021-02-22 NOTE — Patient Instructions (Signed)

## 2021-02-23 NOTE — Progress Notes (Signed)
CM sent to [facility]

## 2021-02-24 DIAGNOSIS — G4733 Obstructive sleep apnea (adult) (pediatric): Secondary | ICD-10-CM | POA: Diagnosis not present

## 2021-02-24 DIAGNOSIS — G4730 Sleep apnea, unspecified: Secondary | ICD-10-CM | POA: Diagnosis not present

## 2021-03-03 ENCOUNTER — Ambulatory Visit: Payer: BC Managed Care – PPO

## 2021-03-08 ENCOUNTER — Encounter: Payer: Self-pay | Admitting: Family Medicine

## 2021-03-08 DIAGNOSIS — G5602 Carpal tunnel syndrome, left upper limb: Secondary | ICD-10-CM | POA: Diagnosis not present

## 2021-03-08 DIAGNOSIS — M5412 Radiculopathy, cervical region: Secondary | ICD-10-CM | POA: Diagnosis not present

## 2021-03-08 DIAGNOSIS — R29898 Other symptoms and signs involving the musculoskeletal system: Secondary | ICD-10-CM | POA: Diagnosis not present

## 2021-03-08 DIAGNOSIS — G5622 Lesion of ulnar nerve, left upper limb: Secondary | ICD-10-CM | POA: Diagnosis not present

## 2021-03-13 ENCOUNTER — Other Ambulatory Visit (HOSPITAL_COMMUNITY): Payer: Self-pay

## 2021-03-14 ENCOUNTER — Other Ambulatory Visit (HOSPITAL_COMMUNITY): Payer: Self-pay

## 2021-03-24 ENCOUNTER — Ambulatory Visit: Payer: BC Managed Care – PPO | Attending: Internal Medicine

## 2021-03-24 ENCOUNTER — Other Ambulatory Visit (HOSPITAL_BASED_OUTPATIENT_CLINIC_OR_DEPARTMENT_OTHER): Payer: Self-pay

## 2021-03-24 ENCOUNTER — Other Ambulatory Visit: Payer: Self-pay

## 2021-03-24 DIAGNOSIS — Z23 Encounter for immunization: Secondary | ICD-10-CM

## 2021-03-24 MED ORDER — PFIZER COVID-19 VAC BIVALENT 30 MCG/0.3ML IM SUSP
INTRAMUSCULAR | 0 refills | Status: DC
  Filled 2021-03-24: qty 0.3, 1d supply, fill #0

## 2021-03-24 NOTE — Progress Notes (Signed)
   Covid-19 Vaccination Clinic  Name:  Robert Hendrix    MRN: [id number] DOB: Jun 07, 1955  03/24/2021  [name] was observed post Covid-19 immunization for 15 minutes without incident. He was provided with Vaccine Information Sheet and instruction to access the V-Safe system.   [name] was instructed to call 911 with any severe reactions post vaccine: Difficulty breathing  Swelling of face and throat  A fast heartbeat  A bad rash all over body  Dizziness and weakness   Immunizations Administered     Name Date Dose VIS Date Route   Pfizer Covid-19 Vaccine Bivalent Booster 03/24/2021  1:01 PM 0.3 mL 01/11/2021 Intramuscular   Manufacturer: [employer]   Lot: [id number]   [place]: [phone]

## 2021-03-27 DIAGNOSIS — G4733 Obstructive sleep apnea (adult) (pediatric): Secondary | ICD-10-CM | POA: Diagnosis not present

## 2021-03-27 DIAGNOSIS — G4730 Sleep apnea, unspecified: Secondary | ICD-10-CM | POA: Diagnosis not present

## 2021-04-03 ENCOUNTER — Other Ambulatory Visit (HOSPITAL_COMMUNITY): Payer: Self-pay

## 2021-04-10 ENCOUNTER — Other Ambulatory Visit (HOSPITAL_COMMUNITY): Payer: Self-pay

## 2021-04-12 ENCOUNTER — Ambulatory Visit (INDEPENDENT_AMBULATORY_CARE_PROVIDER_SITE_OTHER): Payer: BC Managed Care – PPO | Admitting: Plastic Surgery

## 2021-04-12 ENCOUNTER — Other Ambulatory Visit: Payer: Self-pay

## 2021-04-12 DIAGNOSIS — G5622 Lesion of ulnar nerve, left upper limb: Secondary | ICD-10-CM

## 2021-04-12 NOTE — Progress Notes (Signed)
Referring Provider Shon Baton, MD [address],  [place] [address]   CC:  Chief Complaint  Patient presents with   Follow-up      Robert Hendrix is an 65 y.o. male.  HPI: Patient presents about 7 months since my last visit with him.  He has been having persistent issues regarding his left hand.  These were initially believed to be primarily from a radiculopathy related to cervical spine disease.  He has had a cervical fusion and cubital tunnel release about a year ago.  The main reason for coming in today is he seems to have progressive numbness involving his median innervated fingers on his left hand.  He has followed up with his neurosurgeon since seeing me and has also been referred to [facility] where a nerve study was performed which suggested mild neuropathy at the cubital and carpal tunnels.  I have reviewed these and although the numbers are mostly within the normal range the interpretation of the study was consistent with mild ulnar neuropathy at the elbow and median neuropathy at the wrist superimposed on chronic radiculopathy.  He wants to see if anything could be done regarding the median nerve at this point in time.  No Known Allergies  Outpatient Encounter Medications as of 04/12/2021  Medication Sig   cholecalciferol (VITAMIN D) 1000 units tablet Take 1,000 Units by mouth daily.   COVID-19 mRNA bivalent vaccine, Pfizer, (PFIZER COVID-19 VAC BIVALENT) injection Inject into the muscle.   erythromycin ophthalmic ointment Place 1 application into the right eye in the morning and at bedtime.   Evolocumab (REPATHA SURECLICK) 562 MG/ML SOAJ inject 117m Subcutaneous every 2 weeks   fluconazole (DIFLUCAN) 100 MG tablet Take 1 tablet (100 mg total) by mouth every other day for 8 doses.   glucosamine-chondroitin 500-400 MG tablet Take 1 tablet by mouth daily.   ibuprofen (ADVIL,MOTRIN) 800 MG tablet Take 800 mg by mouth 3 (three) times daily as needed for moderate pain.    meloxicam (MOBIC) 15 MG tablet daily as needed.   Multiple Vitamins-Minerals (MULTIVITAMIN ADULTS PO) Take 1 tablet by mouth daily.   niacin (SLO-NIACIN) 500 MG tablet Take 500 mg by mouth daily.   nystatin-triamcinolone (MYCOLOG II) cream APPLY TOPICALLY TO AFFECTED AREA TWICE DAILY AS NEEDED.   trimethoprim-polymyxin b (POLYTRIM) ophthalmic solution Place 1 drop into the right eye every 4 (four) hours.   valACYclovir (VALTREX) 500 MG tablet Take 500 mg by mouth daily.   valACYclovir (VALTREX) 500 MG tablet Take 1 tablet (500 mg total) by mouth daily.   venlafaxine XR (EFFEXOR-XR) 150 MG 24 hr capsule Take 150 mg by mouth daily with breakfast.   venlafaxine XR (EFFEXOR-XR) 150 MG 24 hr capsule TAKE 1 CAPSULE BY MOUTH ONCE A DAY   venlafaxine XR (EFFEXOR-XR) 150 MG 24 hr capsule Take 1 capsule (150 mg total) by mouth daily.   zinc gluconate 50 MG tablet Take 50 mg by mouth daily.   Facility-Administered Encounter Medications as of 04/12/2021  Medication   0.9 %  sodium chloride infusion     Past Medical History:  Diagnosis Date   Alcohol abuse 2002   Cancer ([facility]    basal cell carcinoma of left chest    Chest pain 2017   Drug abuse ([place] 2002   Hip pain    Hyperglycemia    Hyperlipidemia    on meds   Low back pain    Major depression    hx of   Nasal  fracture 1980   Neck pain    OSA on CPAP    Osteoarthritis of thumb    Plantar fasciitis    Prostatitis    Psoriasis    Sleep apnea    OSA uses CPAP-   Thrombosis 2018   LEFT superficial vein thromobsis   Ulnar neuropathy at elbow of left upper extremity 05/04/2019   numbess of fingers    Past Surgical History:  Procedure Laterality Date   COLONOSCOPY  2018   SA-MAC-suprep)ade)-polyps   I & D EXTREMITY N/A 09/21/2017   Procedure: IRRIGATION AND DEBRIDEMENT EXTREMITY;  Surgeon: [name], MD;  Location: [facility];  Service: Orthopedics;  Laterality: N/A;   NOSE SURGERY  1978   Nasal fracture    POLYPECTOMY  2018    polyps   PROSTATE BIOPSY  1990   WISDOM TOOTH EXTRACTION  1990    Family History  Adopted: Yes  Problem Relation Age of Onset   Rectal cancer Neg Hx    Prostate cancer Neg Hx    Pancreatic cancer Neg Hx    Esophageal cancer Neg Hx    Stomach cancer Neg Hx     Social History   Social History Narrative   Lives with wife, Hope   Caffeine use: 6-8 cups per day   Right handed      Review of Systems General: Denies fevers, chills, weight loss CV: Denies chest pain, shortness of breath, palpitations  Physical Exam Vitals with BMI 02/22/2021 08/30/2020 08/17/2020  Height _0  _1  -  Weight 220 lbs 13 oz 226 lbs -  BMI 63.87 56.43 -  Systolic 329 518 841  Diastolic 83 84 99  Pulse 59 60 73    General:  No acute distress,  Alert and oriented, Non-Toxic, Normal speech and affect Left hand: Fingers well-perfused with normal cap refill to palp radial pulse.  He has persistent loss of sensation in the ulnar distribution.  It seems like his median innervated fingers are subjectively worse than before as I compared to my last note.  He does still have full range of motion in flexion extension of his fingers and wrist with good strength.  He can abduct and adduct his fingers fairly well.  He may be slightly weak on that side compared to the other side.  He can activate the median and ulnar lumbricals but he does feel that they are subjectively weaker which is consistent with his last visit with me.  Provocative test for cubital and carpal tunnel syndrome were again negative which is the same as well.  Assessment/Plan Patient presents with progressive numbness in the median innervated fingers.  This is in the setting of history of cervical radiculopathy that affected primarily the ulnar distribution.  The EMG and nerve conduction studies are not overly convincing in my opinion for distal nerve compression however its not unreasonable to perform a diagnostic steroid injection in the carpal  tunnel to offer some clarity.  Patient is interested in moving forward with this.  I explained it would likely offer temporary relief if if there was a nerve compression in that area.  If it did offer relief it would likely be temporary but would clarify the diagnosis.  He is interested in doing this.  We will plan to do a Friday afternoon so that it does not affect his work this week.  [name] 04/12/2021, 5:45 PM

## 2021-04-14 ENCOUNTER — Ambulatory Visit (INDEPENDENT_AMBULATORY_CARE_PROVIDER_SITE_OTHER): Payer: BC Managed Care – PPO | Admitting: Plastic Surgery

## 2021-04-14 ENCOUNTER — Other Ambulatory Visit: Payer: Self-pay

## 2021-04-14 DIAGNOSIS — G5622 Lesion of ulnar nerve, left upper limb: Secondary | ICD-10-CM

## 2021-04-14 DIAGNOSIS — G5602 Carpal tunnel syndrome, left upper limb: Secondary | ICD-10-CM

## 2021-04-14 NOTE — Progress Notes (Signed)
Patient presents for steroid injection to the carpal tunnel of his left wrist.  The hope is that this serves therapeutic and potentially diagnostic purposes.  We discussed risks and benefits and he is interested in moving forward.  The volar aspect of the wrist was prepped with an alcohol pad.  A 27-gauge needle was used to infiltrate 1 cc of lidocaine with epinephrine which was directed towards the ulnar aspect of the carpal tunnel in a proximal to distal direction.  He denied any shooting pains down into his fingers with insertion of the needle.  The 27-gauge needle was left in place and a separate 1 cc syringe of Kenalog 40 was infiltrated into that area.  Again no stinging or burning sensations in his fingers.  The needle was then removed and a Band-Aid was applied.  He subsequently did indicate numbness in the median distribution indicating appropriate placement of the injection.  He tolerated this well.  We will plan to see how he does.  All of his questions were answered.

## 2021-04-17 NOTE — Progress Notes (Signed)
Duplicate visit  

## 2021-05-09 ENCOUNTER — Other Ambulatory Visit (HOSPITAL_COMMUNITY): Payer: Self-pay

## 2021-05-12 ENCOUNTER — Other Ambulatory Visit (HOSPITAL_COMMUNITY): Payer: Self-pay

## 2021-05-18 DIAGNOSIS — R739 Hyperglycemia, unspecified: Secondary | ICD-10-CM | POA: Diagnosis not present

## 2021-05-18 DIAGNOSIS — Z125 Encounter for screening for malignant neoplasm of prostate: Secondary | ICD-10-CM | POA: Diagnosis not present

## 2021-05-18 DIAGNOSIS — E785 Hyperlipidemia, unspecified: Secondary | ICD-10-CM | POA: Diagnosis not present

## 2021-05-26 DIAGNOSIS — R739 Hyperglycemia, unspecified: Secondary | ICD-10-CM | POA: Diagnosis not present

## 2021-05-26 DIAGNOSIS — Z1331 Encounter for screening for depression: Secondary | ICD-10-CM | POA: Diagnosis not present

## 2021-05-26 DIAGNOSIS — Z1339 Encounter for screening examination for other mental health and behavioral disorders: Secondary | ICD-10-CM | POA: Diagnosis not present

## 2021-05-26 DIAGNOSIS — R82998 Other abnormal findings in urine: Secondary | ICD-10-CM | POA: Diagnosis not present

## 2021-05-26 DIAGNOSIS — Z23 Encounter for immunization: Secondary | ICD-10-CM | POA: Diagnosis not present

## 2021-05-26 DIAGNOSIS — Z0000 Encounter for general adult medical examination without abnormal findings: Secondary | ICD-10-CM | POA: Diagnosis not present

## 2021-05-31 ENCOUNTER — Other Ambulatory Visit: Payer: Self-pay | Admitting: *Deleted

## 2021-05-31 ENCOUNTER — Encounter: Payer: Self-pay | Admitting: Family Medicine

## 2021-05-31 DIAGNOSIS — G4733 Obstructive sleep apnea (adult) (pediatric): Secondary | ICD-10-CM

## 2021-06-05 ENCOUNTER — Other Ambulatory Visit (HOSPITAL_COMMUNITY): Payer: Self-pay

## 2021-06-06 ENCOUNTER — Other Ambulatory Visit (HOSPITAL_COMMUNITY): Payer: Self-pay

## 2021-06-06 MED ORDER — REPATHA SURECLICK 140 MG/ML ~~LOC~~ SOAJ
140.0000 mg | SUBCUTANEOUS | 3 refills | Status: DC
  Filled 2021-06-06: qty 2, 28d supply, fill #0
  Filled 2021-09-01: qty 3, 28d supply, fill #0
  Filled 2021-09-01: qty 2, 28d supply, fill #0

## 2021-06-06 MED ORDER — REPATHA SURECLICK 140 MG/ML ~~LOC~~ SOAJ
140.0000 mg | SUBCUTANEOUS | 6 refills | Status: DC
  Filled 2021-06-06: qty 2, 28d supply, fill #0
  Filled 2021-07-03: qty 2, 25d supply, fill #1
  Filled 2021-07-05 – 2021-07-11 (×3): qty 2, 28d supply, fill #1
  Filled 2021-08-09: qty 2, 28d supply, fill #2
  Filled 2021-09-08: qty 2, 28d supply, fill #3
  Filled 2021-10-02: qty 2, 28d supply, fill #4
  Filled 2021-10-30: qty 2, 28d supply, fill #5
  Filled 2021-11-20: qty 2, 28d supply, fill #6

## 2021-07-04 ENCOUNTER — Other Ambulatory Visit (HOSPITAL_COMMUNITY): Payer: Self-pay

## 2021-07-05 ENCOUNTER — Other Ambulatory Visit (HOSPITAL_COMMUNITY): Payer: Self-pay

## 2021-07-11 ENCOUNTER — Other Ambulatory Visit (HOSPITAL_COMMUNITY): Payer: Self-pay

## 2021-07-27 DIAGNOSIS — G4730 Sleep apnea, unspecified: Secondary | ICD-10-CM | POA: Diagnosis not present

## 2021-07-27 DIAGNOSIS — G4733 Obstructive sleep apnea (adult) (pediatric): Secondary | ICD-10-CM | POA: Diagnosis not present

## 2021-08-09 ENCOUNTER — Other Ambulatory Visit (HOSPITAL_COMMUNITY): Payer: Self-pay

## 2021-08-23 ENCOUNTER — Other Ambulatory Visit (HOSPITAL_COMMUNITY): Payer: Self-pay

## 2021-08-31 ENCOUNTER — Other Ambulatory Visit (HOSPITAL_COMMUNITY): Payer: Self-pay

## 2021-09-01 ENCOUNTER — Other Ambulatory Visit (HOSPITAL_COMMUNITY): Payer: Self-pay

## 2021-09-01 MED ORDER — VENLAFAXINE HCL ER 150 MG PO CP24
150.0000 mg | ORAL_CAPSULE | Freq: Every day | ORAL | 3 refills | Status: DC
  Filled 2021-09-01: qty 90, 90d supply, fill #0
  Filled 2021-11-20: qty 90, 90d supply, fill #1

## 2021-09-01 MED ORDER — VALACYCLOVIR HCL 500 MG PO TABS
500.0000 mg | ORAL_TABLET | Freq: Every day | ORAL | 3 refills | Status: DC
  Filled 2021-09-01: qty 90, 90d supply, fill #0
  Filled 2021-11-20: qty 90, 90d supply, fill #1
  Filled 2022-02-20: qty 90, 90d supply, fill #2
  Filled 2022-05-20: qty 90, 90d supply, fill #3

## 2021-09-04 ENCOUNTER — Other Ambulatory Visit (HOSPITAL_COMMUNITY): Payer: Self-pay

## 2021-09-04 MED ORDER — REPATHA SURECLICK 140 MG/ML ~~LOC~~ SOAJ
140.0000 mg | SUBCUTANEOUS | 3 refills | Status: DC
  Filled 2021-09-04: qty 6, 84d supply, fill #0
  Filled 2021-09-06: qty 2, 28d supply, fill #0
  Filled 2021-12-21: qty 6, 84d supply, fill #0
  Filled 2022-03-02 (×2): qty 6, 84d supply, fill #1
  Filled 2022-08-06: qty 6, 84d supply, fill #2

## 2021-09-04 MED ORDER — VENLAFAXINE HCL ER 150 MG PO CP24
150.0000 mg | ORAL_CAPSULE | Freq: Every day | ORAL | 3 refills | Status: DC
  Filled 2021-09-04 – 2022-03-05 (×3): qty 90, 90d supply, fill #0
  Filled 2022-05-20: qty 90, 90d supply, fill #1
  Filled 2022-08-06: qty 90, 90d supply, fill #2

## 2021-09-06 ENCOUNTER — Other Ambulatory Visit (HOSPITAL_COMMUNITY): Payer: Self-pay

## 2021-09-08 ENCOUNTER — Other Ambulatory Visit (HOSPITAL_COMMUNITY): Payer: Self-pay
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
## 2021-10-02 ENCOUNTER — Other Ambulatory Visit (HOSPITAL_COMMUNITY): Payer: Self-pay

## 2021-10-12 ENCOUNTER — Encounter: Payer: Self-pay | Admitting: Surgery

## 2021-10-12 ENCOUNTER — Ambulatory Visit: Payer: 59 | Admitting: Surgery

## 2021-10-12 VITALS — BP 127/81 | HR 65 | Ht 72.0 in | Wt 220.8 lb

## 2021-10-12 DIAGNOSIS — M549 Dorsalgia, unspecified: Secondary | ICD-10-CM

## 2021-10-12 NOTE — Progress Notes (Signed)
66 year old male comes in with his wife today.  Patient's wife has questions about whether or not Robert Hendrix needs orthotics.  She states that she looked at his feet recently and said that the left foot looked different than the right.  I asked patient if he had any problems with pain in either foot or ankle or any issues and he stated no.  He is very active and plays tennis without any complications.  On exam gait is normal.  Bilateral ankle exam unremarkable.  Ligament stable.  Nontender.  No swelling.  Bilateral foot no bruising or swelling.  Nontender.   Plan I advised patient and his wife that if he is not having any issues that I really do not recommend any orthotics.  Continue activities as tolerated.  Follow-up with Dr. [name] as needed.

## 2021-10-30 ENCOUNTER — Other Ambulatory Visit (HOSPITAL_COMMUNITY): Payer: Self-pay

## 2021-11-08 ENCOUNTER — Other Ambulatory Visit (HOSPITAL_COMMUNITY): Payer: Self-pay

## 2021-11-20 ENCOUNTER — Other Ambulatory Visit (HOSPITAL_COMMUNITY): Payer: Self-pay

## 2021-11-21 ENCOUNTER — Other Ambulatory Visit (HOSPITAL_COMMUNITY): Payer: Self-pay

## 2021-12-21 ENCOUNTER — Other Ambulatory Visit (HOSPITAL_COMMUNITY): Payer: Self-pay

## 2022-01-14 ENCOUNTER — Other Ambulatory Visit (HOSPITAL_COMMUNITY): Payer: Self-pay

## 2022-01-14 ENCOUNTER — Telehealth: Payer: 59 | Admitting: Physician Assistant

## 2022-01-14 DIAGNOSIS — H109 Unspecified conjunctivitis: Secondary | ICD-10-CM

## 2022-01-15 MED ORDER — POLYTRIM 10000-0.1 UNIT/ML-% OP SOLN: 1.0000 [drp] | OPHTHALMIC | 0 refills | Status: DC

## 2022-01-15 NOTE — Progress Notes (Signed)

## 2022-01-16 ENCOUNTER — Other Ambulatory Visit (HOSPITAL_COMMUNITY): Payer: Self-pay

## 2022-01-17 MED ORDER — OFLOXACIN 0.3 % OP SOLN: 1.0000 [drp] | Freq: Four times a day (QID) | OPHTHALMIC | 0 refills | Status: DC

## 2022-01-17 NOTE — Addendum Note (Signed)
Addended by: Mar Daring on: 01/17/2022 07:04 AM   Modules accepted: Orders

## 2022-02-08 ENCOUNTER — Ambulatory Visit (INDEPENDENT_AMBULATORY_CARE_PROVIDER_SITE_OTHER): Payer: 59

## 2022-02-08 ENCOUNTER — Encounter: Payer: Self-pay | Admitting: Podiatry

## 2022-02-08 ENCOUNTER — Other Ambulatory Visit (HOSPITAL_COMMUNITY): Payer: Self-pay

## 2022-02-08 ENCOUNTER — Ambulatory Visit: Payer: 59 | Admitting: Podiatry

## 2022-02-08 DIAGNOSIS — S86219A Strain of muscle(s) and tendon(s) of anterior muscle group at lower leg level, unspecified leg, initial encounter: Secondary | ICD-10-CM

## 2022-02-08 DIAGNOSIS — M778 Other enthesopathies, not elsewhere classified: Secondary | ICD-10-CM | POA: Diagnosis not present

## 2022-02-08 MED ORDER — METHYLPREDNISOLONE 4 MG PO TBPK
ORAL_TABLET | ORAL | 0 refills | Status: DC
  Filled 2022-02-08: qty 21, 6d supply, fill #0

## 2022-02-10 NOTE — Progress Notes (Signed)
Subjective:  Patient ID: Robert Hendrix, male    DOB: [birth year],  MRN: [id number] HPI Chief Complaint  Patient presents with   Foot Pain    Dorsal foot and anterior ankle bilateral - aching, swelling x couple months, felt like he pulled something in right a few weeks ago   New Patient (Initial Visit)    66 y.o. male presents with the above complaint.   ROS: Denies fever chills nausea vomiting muscle aches pains calf pain back pain chest pain shortness of breath.  Past Medical History:  Diagnosis Date   Alcohol abuse 2002   Cancer [facility])    basal cell carcinoma of left chest    Chest pain 2017   Drug abuse ([place]) 2002   Hip pain    Hyperglycemia    Hyperlipidemia    on meds   Low back pain    Major depression    hx of   Nasal fracture 1980   Neck pain    OSA on CPAP    Osteoarthritis of thumb    Plantar fasciitis    Prostatitis    Psoriasis    Sleep apnea    OSA uses CPAP-   Thrombosis 2018   LEFT superficial vein thromobsis   Ulnar neuropathy at elbow of left upper extremity 05/04/2019   numbess of fingers   Past Surgical History:  Procedure Laterality Date   COLONOSCOPY  2018   SA-MAC-suprep)ade)-polyps   I & D EXTREMITY N/A 09/21/2017   Procedure: IRRIGATION AND DEBRIDEMENT EXTREMITY;  Surgeon: [name], MD;  Location: [facility];  Service: Orthopedics;  Laterality: N/A;   NOSE SURGERY  1978   Nasal fracture    POLYPECTOMY  2018   polyps   PROSTATE BIOPSY  1990   WISDOM TOOTH EXTRACTION  1990    Current Outpatient Medications:    methylPREDNISolone (MEDROL DOSEPAK) 4 MG TBPK tablet, Take as directed, Disp: 21 tablet, Rfl: 0   cholecalciferol (VITAMIN D) 1000 units tablet, Take 1,000 Units by mouth daily., Disp: , Rfl:    COVID-19 mRNA bivalent vaccine, Pfizer, (PFIZER COVID-19 VAC BIVALENT) injection, Inject into the muscle., Disp: 0.3 mL, Rfl: 0   Evolocumab (REPATHA SURECLICK) 093 MG/ML SOAJ, Inject 140 mg into the skin every 14 (fourteen) days., Disp: 6  mL, Rfl: 3   glucosamine-chondroitin 500-400 MG tablet, Take 1 tablet by mouth daily., Disp: , Rfl:    ibuprofen (ADVIL,MOTRIN) 800 MG tablet, Take 800 mg by mouth 3 (three) times daily as needed for moderate pain., Disp: , Rfl:    meloxicam (MOBIC) 15 MG tablet, daily as needed., Disp: , Rfl:    Multiple Vitamins-Minerals (MULTIVITAMIN ADULTS PO), Take 1 tablet by mouth daily., Disp: , Rfl:    niacin (SLO-NIACIN) 500 MG tablet, Take 500 mg by mouth daily., Disp: , Rfl:    valACYclovir (VALTREX) 500 MG tablet, Take 1 tablet (500 mg total) by mouth daily., Disp: 90 tablet, Rfl: 3   venlafaxine XR (EFFEXOR-XR) 150 MG 24 hr capsule, Take 1 capsule (150 mg total) by mouth daily., Disp: 90 capsule, Rfl: 3   zinc gluconate 50 MG tablet, Take 50 mg by mouth daily., Disp: , Rfl:   Current Facility-Administered Medications:    0.9 %  sodium chloride infusion, 500 mL, Intravenous, Continuous, Armbruster, Carlota Raspberry, MD  No Known Allergies Review of Systems Objective:  There were no vitals filed for this visit.  General: Well developed, nourished, in no acute distress, alert and oriented x3  Dermatological: Skin is warm, dry and supple bilateral. Nails x 10 are well maintained; remaining integument appears unremarkable at this time. There are no open sores, no preulcerative lesions, no rash or signs of infection present.  Vascular: Dorsalis Pedis artery and Posterior Tibial artery pedal pulses are 2/4 bilateral with immedate capillary fill time. Pedal hair growth present. No varicosities and no lower extremity edema present bilateral.   Neruologic: Grossly intact via light touch bilateral. Vibratory intact via tuning fork bilateral. Protective threshold with Semmes Wienstein monofilament intact to all pedal sites bilateral. Patellar and Achilles deep tendon reflexes 2+ bilateral. No Babinski or clonus noted bilateral.   Musculoskeletal: No gross boney pedal deformities bilateral. No pain, crepitus, or  limitation noted with foot and ankle range of motion bilateral. Muscular strength 5/5 in all groups tested bilateral.  He has considerable fluctuance on palpation of the anterior ankle left.  He has much less fluid collection of the anterior ankle right.  Anterior ankle right does demonstrate a very strong tibialis anterior tendon with minimal edema the left demonstrates weak tibialis anterior tendon with loss of continuity just at the level of the extensor retinaculum.  This is exquisitely tender to him with palpation as well.  Gait: Unassisted, Nonantalgic.    Radiographs:  Radiographs taken today demonstrate an osseously mature individual no significant osseous abnormalities other than some early osteoarthritic changes of the ankle joint left greater than right also demonstrates some significant fluid collection of the anterior ankle left with a small bulge of the anterior ankle where the tibialis anterior tendon is most painful.  Assessment & Plan:   Assessment: Cannot rule out a tear of the tibialis anterior tendon left.  Plan: Currently we are requesting MRI of that left ankle for possible tear of the tibialis anterior tendon secondary to trauma.  Also will start him on methylprednisolone and I placed him in a cam boot.     Lorenda Grecco T. [place], [place]

## 2022-02-14 ENCOUNTER — Ambulatory Visit (HOSPITAL_COMMUNITY)
Admission: RE | Admit: 2022-02-14 | Discharge: 2022-02-14 | Disposition: A | Discharge status: Home / Self Care | Payer: 59 | Source: Ambulatory Visit | Attending: Podiatry | Admitting: Podiatry

## 2022-02-14 DIAGNOSIS — S86219A Strain of muscle(s) and tendon(s) of anterior muscle group at lower leg level, unspecified leg, initial encounter: Secondary | ICD-10-CM | POA: Insufficient documentation

## 2022-02-16 ENCOUNTER — Telehealth: Payer: Self-pay | Admitting: *Deleted

## 2022-02-16 NOTE — Telephone Encounter (Signed)
Patient is calling to ask for an exchange of his boot, Velcro not functioning, will come in to exchange.

## 2022-02-18 ENCOUNTER — Encounter: Payer: Self-pay | Admitting: Podiatry

## 2022-02-20 ENCOUNTER — Other Ambulatory Visit: Payer: Self-pay | Admitting: Orthopedic Surgery

## 2022-02-20 ENCOUNTER — Telehealth: Payer: Self-pay

## 2022-02-20 ENCOUNTER — Telehealth: Payer: Self-pay | Admitting: Orthopedic Surgery

## 2022-02-20 ENCOUNTER — Other Ambulatory Visit (HOSPITAL_COMMUNITY): Payer: Self-pay

## 2022-02-20 MED ORDER — OXYCODONE-ACETAMINOPHEN 5-325 MG PO TABS
1.0000 | ORAL_TABLET | ORAL | 0 refills | Status: DC | PRN
  Filled 2022-02-20: qty 30, 5d supply, fill #0

## 2022-02-20 NOTE — Telephone Encounter (Signed)
Patient sustained a injury to his left ankle while playing golf.  He tried to walk it off but had persistent weakness with dorsiflexion of the ankle.  Radiographs obtained were unremarkable.  Review of the MRI scan obtained on October 4 shows complete tear of the anterior tibial tendon at the level of the midfoot with 2 cm of retraction.  Reviewed these findings with the patient and discussed treatment options to include reconstruction of the anterior tibial tendon with surgical intervention.  Risks and benefits of surgery were discussed.  Patient states he understands wishes to proceed with surgery at this time.  We will plan for surgery on Friday as an outpatient.

## 2022-02-20 NOTE — Telephone Encounter (Signed)
I called and left a message on patient's voicemail to please return my call to discuss scheduling.

## 2022-02-20 NOTE — Telephone Encounter (Signed)
Patient called and advised that Dr. Sharol Given was to do surgery this Friday and he is wanting to get that scheduled. Can you please call pt and advise of arrive time. [phone]

## 2022-02-22 ENCOUNTER — Encounter (HOSPITAL_COMMUNITY): Payer: Self-pay | Admitting: Orthopedic Surgery

## 2022-02-22 NOTE — Progress Notes (Signed)
Robert Hendrix denies chest pain or shortness of breath. Patient denies having any s/s of Covid in his household, also denies any known exposure to Covid.   PCP is [name].

## 2022-02-23 ENCOUNTER — Other Ambulatory Visit: Payer: Self-pay

## 2022-02-23 ENCOUNTER — Ambulatory Visit (HOSPITAL_COMMUNITY): Payer: 59 | Admitting: Physician Assistant

## 2022-02-23 ENCOUNTER — Ambulatory Visit (HOSPITAL_COMMUNITY)
Admission: RE | Admit: 2022-02-23 | Discharge: 2022-02-23 | Disposition: A | Discharge status: Home / Self Care | Payer: 59 | Source: Ambulatory Visit | Attending: Orthopedic Surgery | Admitting: Orthopedic Surgery

## 2022-02-23 ENCOUNTER — Encounter (HOSPITAL_COMMUNITY): Payer: Self-pay | Admitting: Orthopedic Surgery

## 2022-02-23 ENCOUNTER — Encounter (HOSPITAL_COMMUNITY)
Admission: RE | Disposition: A | Discharge status: Home / Self Care | Payer: Self-pay | Source: Ambulatory Visit | Attending: Orthopedic Surgery

## 2022-02-23 ENCOUNTER — Ambulatory Visit (HOSPITAL_BASED_OUTPATIENT_CLINIC_OR_DEPARTMENT_OTHER): Payer: 59 | Admitting: Physician Assistant

## 2022-02-23 DIAGNOSIS — S86212A Strain of muscle(s) and tendon(s) of anterior muscle group at lower leg level, left leg, initial encounter: Secondary | ICD-10-CM | POA: Diagnosis not present

## 2022-02-23 DIAGNOSIS — F17290 Nicotine dependence, other tobacco product, uncomplicated: Secondary | ICD-10-CM | POA: Insufficient documentation

## 2022-02-23 DIAGNOSIS — G4733 Obstructive sleep apnea (adult) (pediatric): Secondary | ICD-10-CM | POA: Diagnosis not present

## 2022-02-23 DIAGNOSIS — S86219A Strain of muscle(s) and tendon(s) of anterior muscle group at lower leg level, unspecified leg, initial encounter: Secondary | ICD-10-CM

## 2022-02-23 DIAGNOSIS — X58XXXA Exposure to other specified factors, initial encounter: Secondary | ICD-10-CM | POA: Insufficient documentation

## 2022-02-23 DIAGNOSIS — F32A Depression, unspecified: Secondary | ICD-10-CM | POA: Insufficient documentation

## 2022-02-23 DIAGNOSIS — M1990 Unspecified osteoarthritis, unspecified site: Secondary | ICD-10-CM | POA: Diagnosis not present

## 2022-02-23 DIAGNOSIS — Z01818 Encounter for other preprocedural examination: Secondary | ICD-10-CM

## 2022-02-23 HISTORY — PX: TENDON REPAIR: SHX5111

## 2022-02-23 LAB — COMPREHENSIVE METABOLIC PANEL
ALT: 34 U/L (ref 0–44)
AST: 40 U/L (ref 15–41)
Albumin: 4.2 g/dL (ref 3.5–5.0)
Alkaline Phosphatase: 49 U/L (ref 38–126)
Anion gap: 6 (ref 5–15)
BUN: 20 mg/dL (ref 8–23)
CO2: 26 mmol/L (ref 22–32)
Calcium: 9.5 mg/dL (ref 8.9–10.3)
Chloride: 107 mmol/L (ref 98–111)
Creatinine, Ser: 1 mg/dL (ref 0.61–1.24)
GFR, Estimated: >60 mL/min (ref >60)
Glucose, Bld: 102 mg/dL — ABNORMAL HIGH (ref 70–99)
Potassium: 4.2 mmol/L (ref 3.5–5.1)
Sodium: 139 mmol/L (ref 135–145)
Total Bilirubin: 1.1 mg/dL (ref 0.3–1.2)
Total Protein: 6.8 g/dL (ref 6.5–8.1)

## 2022-02-23 LAB — CBC
HCT: 47.5 % (ref 39.0–52.0)
Hemoglobin: 15.8 g/dL (ref 13.0–17.0)
MCH: 28.1 pg (ref 26.0–34.0)
MCHC: 33.3 g/dL (ref 30.0–36.0)
MCV: 84.4 fL (ref 80.0–100.0)
Platelets: 185 10*3/uL (ref 150–400)
RBC: 5.63 MIL/uL (ref 4.22–5.81)
RDW: 14.4 % (ref 11.5–15.5)
WBC: 5.8 10*3/uL (ref 4.0–10.5)
nRBC: 0 % (ref 0.0–0.2)

## 2022-02-23 SURGERY — TENDON REPAIR
Anesthesia: General | Laterality: Left

## 2022-02-23 MED ORDER — PROPOFOL 10 MG/ML IV BOLUS
INTRAVENOUS | Status: DC | PRN
  Administered 2022-02-23: 200 mg via INTRAVENOUS

## 2022-02-23 MED ORDER — ORAL CARE MOUTH RINSE: 15.0000 mL | Freq: Once | OROMUCOSAL | Status: AC

## 2022-02-23 MED ORDER — STERILE WATER FOR IRRIGATION IR SOLN
Status: DC | PRN
  Administered 2022-02-23: 500 mL

## 2022-02-23 MED ORDER — PROPOFOL 500 MG/50ML IV EMUL
INTRAVENOUS | Status: DC | PRN
  Administered 2022-02-23: 200 ug/kg/min via INTRAVENOUS

## 2022-02-23 MED ORDER — FENTANYL CITRATE (PF) 250 MCG/5ML IJ SOLN
INTRAMUSCULAR | Status: AC
  Filled 2022-02-23: qty 5

## 2022-02-23 MED ORDER — CEFAZOLIN SODIUM-DEXTROSE 2-4 GM/100ML-% IV SOLN
2.0000 g | INTRAVENOUS | Status: AC
  Administered 2022-02-23: 2 g via INTRAVENOUS
  Filled 2022-02-23: qty 100

## 2022-02-23 MED ORDER — LACTATED RINGERS IV SOLN: INTRAVENOUS | Status: DC

## 2022-02-23 MED ORDER — BUPIVACAINE HCL (PF) 0.25 % IJ SOLN
INTRAMUSCULAR | Status: DC | PRN
  Administered 2022-02-23: 20 mL via EPIDURAL

## 2022-02-23 MED ORDER — FENTANYL CITRATE (PF) 100 MCG/2ML IJ SOLN
INTRAMUSCULAR | Status: AC
  Administered 2022-02-23: 100 ug via INTRAVENOUS
  Filled 2022-02-23: qty 2

## 2022-02-23 MED ORDER — BUPIVACAINE-EPINEPHRINE (PF) 0.5% -1:200000 IJ SOLN
INTRAMUSCULAR | Status: DC | PRN
  Administered 2022-02-23: 25 mL via PERINEURAL

## 2022-02-23 MED ORDER — KETOROLAC TROMETHAMINE 30 MG/ML IJ SOLN
INTRAMUSCULAR | Status: AC
  Filled 2022-02-23: qty 1

## 2022-02-23 MED ORDER — CHLORHEXIDINE GLUCONATE 0.12 % MT SOLN
15.0000 mL | Freq: Once | OROMUCOSAL | Status: AC
  Administered 2022-02-23: 15 mL via OROMUCOSAL
  Filled 2022-02-23: qty 15

## 2022-02-23 MED ORDER — KETOROLAC TROMETHAMINE 30 MG/ML IJ SOLN
INTRAMUSCULAR | Status: DC | PRN
  Administered 2022-02-23: 30 mg via INTRAVENOUS

## 2022-02-23 MED ORDER — MIDAZOLAM HCL 2 MG/2ML IJ SOLN: 2.0000 mg | Freq: Once | INTRAMUSCULAR | Status: AC

## 2022-02-23 MED ORDER — LIDOCAINE 2% (20 MG/ML) 5 ML SYRINGE
INTRAMUSCULAR | Status: DC | PRN
  Administered 2022-02-23: 50 mg via INTRAVENOUS

## 2022-02-23 MED ORDER — FENTANYL CITRATE (PF) 100 MCG/2ML IJ SOLN: 100.0000 ug | Freq: Once | INTRAMUSCULAR | Status: AC

## 2022-02-23 MED ORDER — MIDAZOLAM HCL 2 MG/2ML IJ SOLN
INTRAMUSCULAR | Status: AC
  Administered 2022-02-23: 2 mg via INTRAVENOUS
  Filled 2022-02-23: qty 2

## 2022-02-23 MED ORDER — 0.9 % SODIUM CHLORIDE (POUR BTL) OPTIME
TOPICAL | Status: DC | PRN
  Administered 2022-02-23: 1000 mL

## 2022-02-23 SURGICAL SUPPLY — 46 items
BAG COUNTER SPONGE SURGICOUNT (BAG) ×1 IMPLANT
BAG SPNG CNTER NS LX DISP (BAG) ×1
BLADE SURG 21 STRL SS (BLADE) IMPLANT
BNDG CMPR 9X4 STRL LF SNTH (GAUZE/BANDAGES/DRESSINGS) ×1
BNDG COHESIVE 6X5 TAN NS LF (GAUZE/BANDAGES/DRESSINGS) IMPLANT
BNDG ESMARK 4X9 LF (GAUZE/BANDAGES/DRESSINGS) ×1 IMPLANT
BNDG GAUZE DERMACEA FLUFF 4 (GAUZE/BANDAGES/DRESSINGS) IMPLANT
BNDG GZE DERMACEA 4 6PLY (GAUZE/BANDAGES/DRESSINGS) ×1
CORD BIPOLAR FORCEPS 12FT (ELECTRODE) ×1 IMPLANT
COVER SURGICAL LIGHT HANDLE (MISCELLANEOUS) ×2 IMPLANT
CUFF TOURN SGL QUICK 18X4 (TOURNIQUET CUFF) ×1 IMPLANT
CUFF TOURN SGL QUICK 24 (TOURNIQUET CUFF)
CUFF TRNQT CYL 24X4X16.5-23 (TOURNIQUET CUFF) IMPLANT
DRAPE U-SHAPE 47X51 STRL (DRAPES) ×1 IMPLANT
DRSG ADAPTIC 3X8 NADH LF (GAUZE/BANDAGES/DRESSINGS) IMPLANT
DURAPREP 26ML APPLICATOR (WOUND CARE) ×1 IMPLANT
ELECT REM PT RETURN 9FT ADLT (ELECTROSURGICAL) ×1
ELECTRODE REM PT RTRN 9FT ADLT (ELECTROSURGICAL) ×1 IMPLANT
GAUZE PAD ABD 7.5X8 STRL (GAUZE/BANDAGES/DRESSINGS) IMPLANT
GAUZE SPONGE 4X4 12PLY STRL (GAUZE/BANDAGES/DRESSINGS) IMPLANT
GAUZE SPONGE 4X4 16PLY XRAY LF (GAUZE/BANDAGES/DRESSINGS) IMPLANT
GLOVE BIOGEL PI IND STRL 9 (GLOVE) ×1 IMPLANT
GLOVE SURG ORTHO 9.0 STRL STRW (GLOVE) ×1 IMPLANT
GOWN STRL REUS W/ TWL XL LVL3 (GOWN DISPOSABLE) ×2 IMPLANT
GOWN STRL REUS W/TWL XL LVL3 (GOWN DISPOSABLE) ×2
KIT BASIN OR (CUSTOM PROCEDURE TRAY) ×1 IMPLANT
KIT TURNOVER KIT B (KITS) ×1 IMPLANT
NDL HYPO 25GX1X1/2 BEV (NEEDLE) IMPLANT
NDL SUT .5 MAYO 1.404X.05X (NEEDLE) IMPLANT
NEEDLE HYPO 25GX1X1/2 BEV (NEEDLE) IMPLANT
NEEDLE MAYO TAPER (NEEDLE) ×1
NS IRRIG 1000ML POUR BTL (IV SOLUTION) ×1 IMPLANT
PACK ORTHO EXTREMITY (CUSTOM PROCEDURE TRAY) ×1 IMPLANT
PAD ARMBOARD 7.5X6 YLW CONV (MISCELLANEOUS) ×2 IMPLANT
PAD CAST 4YDX4 CTTN HI CHSV (CAST SUPPLIES) IMPLANT
PADDING CAST COTTON 4X4 STRL (CAST SUPPLIES)
RETRIEVER SUT HEWSON (MISCELLANEOUS) IMPLANT
SUT ETHILON 2 0 PSLX (SUTURE) IMPLANT
SUT FIBERWIRE 2-0 18 17.9 3/8 (SUTURE) ×2
SUT VIC AB 2-0 FS1 27 (SUTURE) IMPLANT
SUTURE FIBERWR 2-0 18 17.9 3/8 (SUTURE) IMPLANT
SYR CONTROL 10ML LL (SYRINGE) IMPLANT
TOWEL GREEN STERILE (TOWEL DISPOSABLE) ×1 IMPLANT
TOWEL GREEN STERILE FF (TOWEL DISPOSABLE) ×1 IMPLANT
TUBE CONNECTING 12X1/4 (SUCTIONS) IMPLANT
WATER STERILE IRR 1000ML POUR (IV SOLUTION) ×1 IMPLANT

## 2022-02-23 NOTE — Anesthesia Procedure Notes (Signed)
Procedure Name: LMA Insertion Date/Time: 02/23/2022 12:26 PM  Performed by: Gwyndolyn Saxon, CRNAPre-anesthesia Checklist: Patient identified, Emergency Drugs available, Suction available and Patient being monitored Patient Re-evaluated:Patient Re-evaluated prior to induction Oxygen Delivery Method: Circle system utilized Preoxygenation: Pre-oxygenation with 100% oxygen Induction Type: IV induction Ventilation: Mask ventilation without difficulty LMA: LMA inserted LMA Size: 4.0 Number of attempts: 1 Placement Confirmation: positive ETCO2 and breath sounds checked- equal and bilateral Tube secured with: Tape Dental Injury: Teeth and Oropharynx as per pre-operative assessment

## 2022-02-23 NOTE — Anesthesia Postprocedure Evaluation (Signed)
Anesthesia Post Note  Patient: Robert Hendrix  Procedure(s) Performed: LEFT ANTERIOR TIBIAL TENDON RECONSTRUCTION (Left)     Patient location during evaluation: PACU Anesthesia Type: General Level of consciousness: sedated Pain management: pain level controlled Vital Signs Assessment: post-procedure vital signs reviewed and stable Respiratory status: spontaneous breathing and respiratory function stable Cardiovascular status: stable Postop Assessment: no apparent nausea or vomiting Anesthetic complications: no   No notable events documented.  Last Vitals:  Vitals:   02/23/22 1330 02/23/22 1345  BP: 131/77 136/82  Pulse: 62 (!) 59  Resp: 16 17  Temp:  (!) 36.1 C  SpO2: 99% 100%    Last Pain:  Vitals:   02/23/22 1345  PainSc: 0-No pain                 Wai Minotti DANIEL

## 2022-02-23 NOTE — Anesthesia Preprocedure Evaluation (Signed)
Anesthesia Evaluation  Patient identified by MRN, date of birth, ID band Patient awake    Reviewed: Allergy & Precautions, H&P , NPO status , Patient's Chart, lab work & pertinent test results  Airway Mallampati: II   Neck ROM: full    Dental   Pulmonary sleep apnea , Current Smoker,    breath sounds clear to auscultation       Cardiovascular negative cardio ROS   Rhythm:regular Rate:Normal     Neuro/Psych PSYCHIATRIC DISORDERS Depression    GI/Hepatic   Endo/Other    Renal/GU      Musculoskeletal  (+) Arthritis ,   Abdominal   Peds  Hematology   Anesthesia Other Findings   Reproductive/Obstetrics                             Anesthesia Physical Anesthesia Plan  ASA: 2  Anesthesia Plan: General   Post-op Pain Management: Regional block*   Induction: Intravenous  PONV Risk Score and Plan: 1 and Ondansetron, Dexamethasone, Midazolam and Treatment may vary due to age or medical condition  Airway Management Planned: LMA  Additional Equipment:   Intra-op Plan:   Post-operative Plan: Extubation in OR  Informed Consent: I have reviewed the patients History and Physical, chart, labs and discussed the procedure including the risks, benefits and alternatives for the proposed anesthesia with the patient or authorized representative who has indicated his/her understanding and acceptance.     Dental advisory given  Plan Discussed with: Anesthesiologist, CRNA and Surgeon  Anesthesia Plan Comments:         Anesthesia Quick Evaluation

## 2022-02-23 NOTE — Anesthesia Procedure Notes (Signed)
Anesthesia Regional Block: Popliteal block   Pre-Anesthetic Checklist: , timeout performed,  Correct Patient, Correct Site, Correct Laterality,  Correct Procedure, Correct Position, site marked,  Risks and benefits discussed,  Surgical consent,  Pre-op evaluation,  At surgeon's request and post-op pain management  Laterality: Left  Prep: chloraprep       Needles:  Injection technique: Single-shot  Needle Type: Echogenic Stimulator Needle          Additional Needles:   Procedures:, nerve stimulator,,,,,     Nerve Stimulator or Paresthesia:  Response: plantar flexion of foot, 0.45 mA  Additional Responses:   Narrative:  Start time: 02/23/2022 12:00 PM End time: 02/23/2022 12:09 PM Injection made incrementally with aspirations every 5 mL.  Performed by: Personally  Anesthesiologist: Albertha Ghee, MD  Additional Notes: Functioning IV was confirmed and monitors were applied.  A 85m 21ga Arrow echogenic stimulator needle was used. Sterile prep and drape,hand hygiene and sterile gloves were used.  Negative aspiration and negative test dose prior to incremental administration of local anesthetic. The patient tolerated the procedure well.  Ultrasound guidance: relevent anatomy identified, needle position confirmed, local anesthetic spread visualized around nerve(s), vascular puncture avoided.  Image printed for medical record.

## 2022-02-23 NOTE — Transfer of Care (Signed)
Immediate Anesthesia Transfer of Care Note  Patient: Robert Hendrix  Procedure(s) Performed: LEFT ANTERIOR TIBIAL TENDON RECONSTRUCTION (Left)  Patient Location: PACU  Anesthesia Type:General and Regional  Level of Consciousness: drowsy and patient cooperative  Airway & Oxygen Therapy: Patient Spontanous Breathing  Post-op Assessment: Report given to RN and Post -op Vital signs reviewed and stable  Post vital signs: Reviewed and stable  Last Vitals:  Vitals Value Taken Time  BP 127/70 02/23/22 1316  Temp    Pulse 65 02/23/22 1319  Resp 13 02/23/22 1319  SpO2 95 % 02/23/22 1319  Vitals shown include unvalidated device data.  Last Pain:  Vitals:   02/23/22 1141  PainSc: 0-No pain         Complications: No notable events documented.

## 2022-02-23 NOTE — Op Note (Signed)
02/23/2022  1:17 PM  PATIENT:  Robert Hendrix    PRE-OPERATIVE DIAGNOSIS:  Left Anterior Tibial Tendon Rupture  POST-OPERATIVE DIAGNOSIS:  Same  PROCEDURE:  LEFT ANTERIOR TIBIAL TENDON RECONSTRUCTION  SURGEON:  Newt Minion, MD  PHYSICIAN ASSISTANT:None ANESTHESIA:   General  PREOPERATIVE INDICATIONS:  Robert Hendrix is a  66 y.o. male with a diagnosis of Left Anterior Tibial Tendon Rupture who failed conservative measures and elected for surgical management.    The risks benefits and alternatives were discussed with the patient preoperatively including but not limited to the risks of infection, bleeding, nerve injury, cardiopulmonary complications, the need for revision surgery, among others, and the patient was willing to proceed.  OPERATIVE IMPLANTS: 2-0 FiberWire x2.  '@ENCIMAGES'$ @  OPERATIVE FINDINGS: Patient had chronic degenerative changes of the residual ends of the anterior tibial tendon rupture.  This was a rupture secondary to chronic degenerative changes.  OPERATIVE PROCEDURE: Patient was brought to the operating room after regional anesthetic he then underwent a general anesthesia.  After adequate levels anesthesia were obtained patient's left lower extremity was prepped using DuraPrep draped into a sterile field a timeout was called.  An incision was made dorsally along the path of the anterior tibial tendon this was carried sharply down through skin blunt dissection was carried down to the retinaculum.  Both ends of the tendon shows signs of chronic degenerative changes with approximately a centimeter of degenerative changes to both ends of the tendon.  These degenerative areas were resected.  Using Krakw suture technique a 2-0 FiberWire was woven through the proximal and distal stumps with 4 sutures at the repair site.  The foot was dorsiflexed and the rupture was repaired with suture knots intersubstance.  The foot was relaxed and the tendon had good tension.  Wound was  irrigated with normal saline.  The dorsal retinaculum over the ankle was not disrupted.  Skin was repaired using 2-0 nylon.  A sterile dressing was applied patient was extubated taken the PACU in stable condition.   DISCHARGE PLANNING:  Antibiotic duration: Preoperative antibiotics  Weightbearing: Nonweightbearing on the left with a cam walker.  Pain medication: Has a prescription for Percocet  Dressing care/ Wound VAC: Change dressing at follow-up  Ambulatory devices: Crutches or kneeling scooter  Discharge to: Home.  Follow-up: In the office 1 week post operative.

## 2022-02-23 NOTE — Anesthesia Procedure Notes (Signed)
Anesthesia Regional Block: Adductor canal block   Pre-Anesthetic Checklist: , timeout performed,  Correct Patient, Correct Site, Correct Laterality,  Correct Procedure, Correct Position, site marked,  Risks and benefits discussed,  Surgical consent,  Pre-op evaluation,  At surgeon's request and post-op pain management  Laterality: Left  Prep: chloraprep       Needles:  Injection technique: Single-shot  Needle Type: Echogenic Needle     Needle Length: 9cm  Needle Gauge: 21     Additional Needles:   Narrative:  Start time: 02/23/2022 12:10 PM End time: 02/23/2022 12:15 PM Injection made incrementally with aspirations every 5 mL.  Performed by: Personally  Anesthesiologist: Albertha Ghee, MD  Additional Notes: Pt tolerated the procedure well.

## 2022-02-23 NOTE — H&P (Signed)
Robert Hendrix is an 66 y.o. male.   Chief Complaint: Left ankle pain and weakness. HPI: Patient is a 66 year old gentleman who was playing golf and had acute episode of pain he felt like he could walk this off but it still has persistent pain and swelling.  MRI scan confirmed a rupture of the anterior tibial tendon.  Past Medical History:  Diagnosis Date   Alcohol abuse 2002   Cancer [facility])    basal cell carcinoma of left chest    Chest pain 2017   Drug abuse ([place]) 2002   Hip pain    Hyperglycemia    Hyperlipidemia    on meds   Low back pain    Major depression    hx of   Nasal fracture 1980   Neck pain    OSA on CPAP    Osteoarthritis of thumb    Plantar fasciitis    Prostatitis    Psoriasis    Sleep apnea    OSA uses CPAP-   Thrombosis 2018   LEFT superficial vein thromobsis   Ulnar neuropathy at elbow of left upper extremity 05/04/2019   numbess of fingers    Past Surgical History:  Procedure Laterality Date   COLONOSCOPY  2018   SA-MAC-suprep)ade)-polyps   I & D EXTREMITY N/A 09/21/2017   Procedure: IRRIGATION AND DEBRIDEMENT EXTREMITY;  Surgeon: [name], MD;  Location[facility];  Service: Orthopedics;  Laterality: N/A;   NOSE SURGERY  1978   Nasal fracture    POLYPECTOMY  2018   polyps   PROSTATE BIOPSY  1990   WISDOM TOOTH EXTRACTION  1990    Family History  Adopted: Yes  Problem Relation Age of Onset   Rectal cancer Neg Hx    Prostate cancer Neg Hx    Pancreatic cancer Neg Hx    Esophageal cancer Neg Hx    Stomach cancer Neg Hx    Social History:  reports that he has been smoking cigars. He has never used smokeless tobacco. He reports that he does not currently use drugs. He reports that he does not drink alcohol.  Allergies: No Known Allergies  Facility-Administered Medications Prior to Admission  Medication Dose Route Frequency Provider Last Rate Last Admin   0.9 %  sodium chloride infusion  500 mL Intravenous Continuous Armbruster, Carlota Raspberry,  MD       Medications Prior to Admission  Medication Sig Dispense Refill   acetaminophen (TYLENOL) 500 MG tablet Take 1,000 mg by mouth every 8 (eight) hours as needed for moderate pain.     ascorbic acid (VITAMIN C) 500 MG tablet Take 500 mg by mouth daily.     aspirin EC 81 MG tablet Take 81 mg by mouth daily. Swallow whole.     Cholecalciferol (VITAMIN D) 125 MCG (5000 UT) CAPS Take 5,000 Units by mouth daily.     cyanocobalamin (VITAMIN B12) 1000 MCG tablet Take 1,000 mcg by mouth daily.     Evolocumab (REPATHA SURECLICK) 478 MG/ML SOAJ Inject 140 mg into the skin every 14 (fourteen) days. 6 mL 3   ibuprofen (ADVIL,MOTRIN) 800 MG tablet Take 800 mg by mouth 3 (three) times daily as needed for moderate pain.     loratadine (CLARITIN) 10 MG tablet Take 10 mg by mouth daily as needed for allergies.     Misc Natural Products (GLUCOSAMINE CHOND MSM FORMULA PO) Take 1 tablet by mouth daily.     Multiple Vitamins-Minerals (MULTIVITAMIN ADULTS PO) Take 1  tablet by mouth daily.     naproxen sodium (ALEVE) 220 MG tablet Take 440 mg by mouth 2 (two) times daily as needed (pain).     niacin (SLO-NIACIN) 500 MG tablet Take 500 mg by mouth daily.     valACYclovir (VALTREX) 500 MG tablet Take 1 tablet (500 mg total) by mouth daily. 90 tablet 3   venlafaxine XR (EFFEXOR-XR) 150 MG 24 hr capsule Take 1 capsule (150 mg total) by mouth daily. 90 capsule 3   Zinc Gluconate 15 MG TABS Take 15 mg by mouth daily.     COVID-19 mRNA bivalent vaccine, Pfizer, (PFIZER COVID-19 VAC BIVALENT) injection Inject into the muscle. 0.3 mL 0   oxyCODONE-acetaminophen (PERCOCET/ROXICET) 5-325 MG tablet Take 1 tablet by mouth every 4 (four) hours as needed for severe pain. 30 tablet 0    No results found for this or any previous visit (from the past 48 hour(s)). No results found.  Review of Systems  All other systems reviewed and are negative.   There were no vitals taken for this visit. Physical Exam  Examination  patient has a good dorsalis pedis pulse.  He has dorsiflexion through the common extensors however there is no dorsiflexion through the anterior tibial tendon.  It is retracted and the end of the tendon is palpable just proximal to the retinaculum.  I cannot palpate the distal aspect of the anterior tibial tendon.  There is no skin breakdown no redness no cellulitis.  Review of the MRI scan shows a ruptured and retracted anterior tibial tendon. Assessment/Plan Assessment: Ruptured and retracted anterior tibial tendon.  Plan: We will plan for reconstruction of the anterior tibial tendon ideally will not have to disrupt the retinaculum.  Patient may require suture anchors.  Risks and benefits were discussed including infection neurovascular injury DVT need for additional surgery.  Patient states he understands wished to proceed at this time.  Newt Minion, MD 02/23/2022, 9:32 AM

## 2022-02-24 ENCOUNTER — Encounter (HOSPITAL_COMMUNITY): Payer: Self-pay | Admitting: Orthopedic Surgery

## 2022-02-26 ENCOUNTER — Ambulatory Visit: Payer: BC Managed Care – PPO | Admitting: Family Medicine

## 2022-03-02 ENCOUNTER — Other Ambulatory Visit (HOSPITAL_COMMUNITY): Payer: Self-pay

## 2022-03-05 ENCOUNTER — Encounter: Payer: Self-pay | Admitting: Orthopedic Surgery

## 2022-03-05 ENCOUNTER — Other Ambulatory Visit (HOSPITAL_COMMUNITY): Payer: Self-pay

## 2022-03-05 ENCOUNTER — Ambulatory Visit (INDEPENDENT_AMBULATORY_CARE_PROVIDER_SITE_OTHER): Payer: 59 | Admitting: Orthopedic Surgery

## 2022-03-05 DIAGNOSIS — S86212D Strain of muscle(s) and tendon(s) of anterior muscle group at lower leg level, left leg, subsequent encounter: Secondary | ICD-10-CM

## 2022-03-05 NOTE — Progress Notes (Signed)
Office Visit Note   Patient: ARIEN BENINCASA           Date of Birth: [birth year]           MRN: [id number] Visit Date: 03/05/2022              Requested by: Shon Baton, MD [address] [place],  [place] [address] PCP: Shon Baton, MD  Chief Complaint  Patient presents with   Left Ankle - Routine Post Op    02/23/2022 left anterior tibial tendon reconstruction       HPI: Patient is a 66 year old gentleman who presents 10 days status post anterior tibial tendon reconstruction for chronic rupture.  He is currently nonweightbearing in a kneeling scooter in a fracture boot.  Assessment & Plan: Visit Diagnoses:  1. Traumatic rupture of anterior tibial tendon, left, subsequent encounter     Plan: Sutures are harvested he may stand with the fracture boot but minimize walking continue with a kneeling scooter.  Patient was given samples of a compression sock to help with the swelling.  Follow-Up Instructions: Return in about 3 weeks (around 03/26/2022).   Ortho Exam  Patient is alert, oriented, no adenopathy, well-dressed, normal affect, normal respiratory effort. Examination the incision is well approximated there is no drainage no cellulitis.  Patient has active ankle dorsiflexion with the repair.  Imaging: No results found. No images are attached to the encounter.  Labs: No results found for: "HGBA1C", "ESRSEDRATE", "CRP", "LABURIC", "REPTSTATUS", "GRAMSTAIN", "CULT", "LABORGA"   Lab Results  Component Value Date   ALBUMIN 4.2 02/23/2022    No results found for: "MG" No results found for: "VD25OH"  No results found for: "PREALBUMIN"    Latest Ref Rng & Units 02/23/2022    8:54 AM 08/17/2020    4:16 PM 09/21/2017    1:22 PM  CBC EXTENDED  WBC 4.0 - 10.5 K/uL 5.8  5.8  6.6   RBC 4.22 - 5.81 MIL/uL 5.63  5.23  5.22   Hemoglobin 13.0 - 17.0 g/dL 15.8  14.5  14.6   HCT 39.0 - 52.0 % 47.5  44.4  45.0   Platelets 150 - 400 K/uL 185  179  161   NEUT# 1.7 - 7.7 K/uL  3.3   5.1   Lymph# 0.7 - 4.0 K/uL  1.8  1.0      There is no height or weight on file to calculate BMI.  Orders:  No orders of the defined types were placed in this encounter.  No orders of the defined types were placed in this encounter.    Procedures: No procedures performed  Clinical Data: No additional findings.  ROS:  All other systems negative, except as noted in the HPI. Review of Systems  Objective: Vital Signs: There were no vitals taken for this visit.  Specialty Comments:  No specialty comments available.  PMFS History: Patient Active Problem List   Diagnosis Date Noted   Anterior tibial tendon tear, traumatic 02/23/2022   Weakness of left hand 10/12/2019   Ulnar neuropathy at elbow of left upper extremity 05/04/2019   OSA on CPAP 02/23/2019   Wound of left upper extremity 10/08/2017   Past Medical History:  Diagnosis Date   Alcohol abuse 2002   Cancer [facility])    basal cell carcinoma of left chest    Chest pain 2017   Drug abuse ([place]) 2002   Hip pain    Hyperglycemia    Hyperlipidemia    on meds  Low back pain    Major depression    hx of   Nasal fracture 1980   Neck pain    OSA on CPAP    Osteoarthritis of thumb    Plantar fasciitis    Prostatitis    Psoriasis    Sleep apnea    OSA uses CPAP-   Thrombosis 2018   LEFT superficial vein thromobsis   Ulnar neuropathy at elbow of left upper extremity 05/04/2019   numbess of fingers    Family History  Adopted: Yes  Problem Relation Age of Onset   Rectal cancer Neg Hx    Prostate cancer Neg Hx    Pancreatic cancer Neg Hx    Esophageal cancer Neg Hx    Stomach cancer Neg Hx     Past Surgical History:  Procedure Laterality Date   COLONOSCOPY  2018   SA-MAC-suprep)ade)-polyps   I & D EXTREMITY N/A 09/21/2017   Procedure: IRRIGATION AND DEBRIDEMENT EXTREMITY;  Surgeon: [name], MD;  [facility];  Service: Orthopedics;  Laterality: N/A;   NOSE SURGERY  1978   Nasal fracture     POLYPECTOMY  2018   polyps   PROSTATE BIOPSY  1990   TENDON REPAIR Left 02/23/2022   Procedure: LEFT ANTERIOR TIBIAL TENDON RECONSTRUCTION;  Surgeon: Newt Minion, MD;  Location: [place];  Service: Orthopedics;  Laterality: Left;   [place] EXTRACTION  1990   Social History   Occupational History   Not on file  Tobacco Use   Smoking status: Light Smoker    Types: Cigars   Smokeless tobacco: Never   Tobacco comments:    1 cigar daily  Vaping Use   Vaping Use: Never used  Substance and Sexual Activity   Alcohol use: No   Drug use: Not Currently    Comment: 02/22/22- clean 20 yeasr   Sexual activity: Not on file

## 2022-03-26 ENCOUNTER — Ambulatory Visit (INDEPENDENT_AMBULATORY_CARE_PROVIDER_SITE_OTHER): Payer: 59 | Admitting: Orthopedic Surgery

## 2022-03-26 ENCOUNTER — Encounter: Payer: Self-pay | Admitting: Orthopedic Surgery

## 2022-03-26 ENCOUNTER — Other Ambulatory Visit (HOSPITAL_COMMUNITY): Payer: Self-pay

## 2022-03-26 DIAGNOSIS — S86212D Strain of muscle(s) and tendon(s) of anterior muscle group at lower leg level, left leg, subsequent encounter: Secondary | ICD-10-CM

## 2022-03-26 NOTE — Progress Notes (Signed)
Office Visit Note   Patient: Robert Hendrix           Date of Birth: 1955-08-02           MRN: [id number] Visit Date: 03/26/2022              Requested by: Shon Baton, [address],  [place] [address] PCP: Shon Baton, MD  Chief Complaint  Patient presents with   Right Ankle - Routine Post Op    02/23/2022 left anterior tibial tendon reconstruction      HPI: Patient is a 66 year old gentleman who presents 4 weeks status post reconstruction rupture left anterior tibial tendon.  Patient is currently in a kneeling scooter protected weightbearing.  Assessment & Plan: Visit Diagnoses:  1. Traumatic rupture of anterior tibial tendon, left, subsequent encounter     Plan: Patient has shown excellent progress he has good strength with dorsiflexion of the ankle.  We will set him up with physical therapy upstairs we will give him a shorter fracture boot and follow-up as needed.  Follow-Up Instructions: Return if symptoms worsen or fail to improve.   Ortho Exam  Patient is alert, oriented, no adenopathy, well-dressed, normal affect, normal respiratory effort. Examination the incision is well-healed there is no swelling he is wearing a compression sock.  Patient has good dorsiflexion strength.  He also has good EHL strength.  Imaging: No results found. No images are attached to the encounter.  Labs: No results found for: "HGBA1C", "ESRSEDRATE", "CRP", "LABURIC", "REPTSTATUS", "GRAMSTAIN", "CULT", "LABORGA"   Lab Results  Component Value Date   ALBUMIN 4.2 02/23/2022    No results found for: "MG" No results found for: "VD25OH"  No results found for: "PREALBUMIN"    Latest Ref Rng & Units 02/23/2022    8:54 AM 08/17/2020    4:16 PM 09/21/2017    1:22 PM  CBC EXTENDED  WBC 4.0 - 10.5 K/uL 5.8  5.8  6.6   RBC 4.22 - 5.81 MIL/uL 5.63  5.23  5.22   Hemoglobin 13.0 - 17.0 g/dL 15.8  14.5  14.6   HCT 39.0 - 52.0 % 47.5  44.4  45.0   Platelets 150 - 400 K/uL 185   179  161   NEUT# 1.7 - 7.7 K/uL  3.3  5.1   Lymph# 0.7 - 4.0 K/uL  1.8  1.0      There is no height or weight on file to calculate BMI.  Orders:  No orders of the defined types were placed in this encounter.  No orders of the defined types were placed in this encounter.    Procedures: No procedures performed  Clinical Data: No additional findings.  ROS:  All other systems negative, except as noted in the HPI. Review of Systems  Objective: Vital Signs: There were no vitals taken for this visit.  Specialty Comments:  No specialty comments available.  PMFS History: Patient Active Problem List   Diagnosis Date Noted   Anterior tibial tendon tear, traumatic 02/23/2022   Weakness of left hand 10/12/2019   Ulnar neuropathy at elbow of left upper extremity 05/04/2019   OSA on CPAP 02/23/2019   Wound of left upper extremity 10/08/2017   Past Medical History:  Diagnosis Date   Alcohol abuse 2002   Cancer [facility])    basal cell carcinoma of left chest    Chest pain 2017   Drug abuse ([place]) 2002   Hip pain    Hyperglycemia  Hyperlipidemia    on meds   Low back pain    Major depression    hx of   Nasal fracture 1980   Neck pain    OSA on CPAP    Osteoarthritis of thumb    Plantar fasciitis    Prostatitis    Psoriasis    Sleep apnea    OSA uses CPAP-   Thrombosis 2018   LEFT superficial vein thromobsis   Ulnar neuropathy at elbow of left upper extremity 05/04/2019   numbess of fingers    Family History  Adopted: Yes  Problem Relation Age of Onset   Rectal cancer Neg Hx    Prostate cancer Neg Hx    Pancreatic cancer Neg Hx    Esophageal cancer Neg Hx    Stomach cancer Neg Hx     Past Surgical History:  Procedure Laterality Date   COLONOSCOPY  2018   SA-MAC-suprep)ade)-polyps   I & D EXTREMITY N/A 09/21/2017   Procedure: IRRIGATION AND DEBRIDEMENT EXTREMITY;  Surgeon: [name], MD;  [facility];  Service: Orthopedics;  Laterality: N/A;   NOSE  SURGERY  1978   Nasal fracture    POLYPECTOMY  2018   polyps   PROSTATE BIOPSY  1990   TENDON REPAIR Left 02/23/2022   Procedure: LEFT ANTERIOR TIBIAL TENDON RECONSTRUCTION;  Surgeon: Newt Minion, MD;  Location: [place];  Service: Orthopedics;  Laterality: Left;   [place] EXTRACTION  1990   Social History   Occupational History   Not on file  Tobacco Use   Smoking status: Light Smoker    Types: Cigars   Smokeless tobacco: Never   Tobacco comments:    1 cigar daily  Vaping Use   Vaping Use: Never used  Substance and Sexual Activity   Alcohol use: No   Drug use: Not Currently    Comment: 02/22/22- clean 20 yeasr   Sexual activity: Not on file

## 2022-03-28 ENCOUNTER — Encounter: Payer: Self-pay | Admitting: Rehabilitative and Restorative Service Providers"

## 2022-03-28 ENCOUNTER — Other Ambulatory Visit (HOSPITAL_COMMUNITY): Payer: Self-pay

## 2022-03-28 ENCOUNTER — Other Ambulatory Visit: Payer: Self-pay

## 2022-03-28 ENCOUNTER — Ambulatory Visit: Payer: 59 | Admitting: Rehabilitative and Restorative Service Providers"

## 2022-03-28 DIAGNOSIS — M6281 Muscle weakness (generalized): Secondary | ICD-10-CM

## 2022-03-28 DIAGNOSIS — R262 Difficulty in walking, not elsewhere classified: Secondary | ICD-10-CM

## 2022-03-28 DIAGNOSIS — M79662 Pain in left lower leg: Secondary | ICD-10-CM

## 2022-03-28 NOTE — Therapy (Signed)
OUTPATIENT PHYSICAL THERAPY EVALUATION   Patient Name: Robert Hendrix MRN: [id number] DOB:25-Mar-1956, 66 y.o., male Today's Date: 03/28/2022  END OF SESSION:   PT End of Session - 03/28/22 1611     Visit Number 1    Number of Visits 20    Date for PT Re-Evaluation 06/06/22    Authorization Type [facility] [employer] $25 copay    PT Start Time 1608    PT Stop Time 1635    PT Time Calculation (min) 27 min    Activity Tolerance Patient tolerated treatment well    Behavior During Therapy WFL for tasks assessed/performed             Past Medical History:  Diagnosis Date   Alcohol abuse 2002   Cancer ([place])    basal cell carcinoma of left chest    Chest pain 2017   Drug abuse ([place]) 2002   Hip pain    Hyperglycemia    Hyperlipidemia    on meds   Low back pain    Major depression    hx of   Nasal fracture 1980   Neck pain    OSA on CPAP    Osteoarthritis of thumb    Plantar fasciitis    Prostatitis    Psoriasis    Sleep apnea    OSA uses CPAP-   Thrombosis 2018   LEFT superficial vein thromobsis   Ulnar neuropathy at elbow of left upper extremity 05/04/2019   numbess of fingers   Past Surgical History:  Procedure Laterality Date   COLONOSCOPY  2018   SA-MAC-suprep)ade)-polyps   I & D EXTREMITY N/A 09/21/2017   Procedure: IRRIGATION AND DEBRIDEMENT EXTREMITY;  Surgeon: [name], MD;  [facility];  Service: Orthopedics;  Laterality: N/A;   NOSE SURGERY  1978   Nasal fracture    POLYPECTOMY  2018   polyps   PROSTATE BIOPSY  1990   TENDON REPAIR Left 02/23/2022   Procedure: LEFT ANTERIOR TIBIAL TENDON RECONSTRUCTION;  Surgeon: Newt Minion, MD;  Location: [place];  Service: Orthopedics;  Laterality: Left;   [place] EXTRACTION  1990   Patient Active Problem List   Diagnosis Date Noted   Anterior tibial tendon tear, traumatic 02/23/2022   Weakness of left hand 10/12/2019   Ulnar neuropathy at elbow of left upper extremity 05/04/2019   OSA on CPAP  02/23/2019   Wound of left upper extremity 10/08/2017    PCP: Shon Baton MD  REFERRING PROVIDER: Newt Minion, MD  REFERRING DIAG: [phone] (ICD-10-CM) - Traumatic rupture of anterior tibial tendon, left, subsequent encounter  THERAPY DIAG:  Pain in left lower leg  Muscle weakness (generalized)  Difficulty in walking, not elsewhere classified  Rationale for Evaluation and Treatment: Rehabilitation  ONSET DATE: surgery 02/23/2022   SUBJECTIVE:   SUBJECTIVE STATEMENT: Surgery 02/23/2022 for Lt anterior tibial tendon reconstruction.  Pt was playing tennis and felt pop in foot.  He was able to play but noticed limitation.  Pt indicated not having much pain, just tightness.  Started walking in boot yesterday with good results.    PERTINENT HISTORY: Rt foot pain indicated concurrently.   PAIN:  NPRS scale:  1/10 Pain location: Lt lower leg Pain description: tightness Aggravating factors: nothing specific Relieving factors: nothing specific  PRECAUTIONS: No forced PF  WEIGHT BEARING RESTRICTIONS: NWB  FALLS:  Has patient fallen in last 6 months? 1, no fear of falling  LIVING ENVIRONMENT: Lives in: House/apartment Stairs: 3 to  enter house with handrail   OCCUPATION: Chief Financial Officer, has been working.   PLOF: Independent, tennis, biking, snowski trip  PATIENT GOALS: Resume sport activity.    OBJECTIVE:   PATIENT SURVEYS:  03/28/2022 FOTO intake: 69   predicted:  79  COGNITION: 03/28/2022 Overall cognitive status: WFL    SENSATION: 03/28/2022 WFL  POSTURE: 03/28/2022 No Significant postural limitations  PALPATION: 03/28/2022 No specific tenderness noted  LOWER EXTREMITY ROM:   AROM Right 03/28/2022 Left 03/28/2022  Hip flexion    Hip extension    Hip abduction    Hip adduction    Hip internal rotation    Hip external rotation    Knee flexion    Knee extension    Ankle dorsiflexion 6 in 90 deg knee flexion 0 in 90 deg knee flexion  Ankle  plantarflexion  Not tested due to surgery  Ankle inversion 30 15  Ankle eversion 24 25   (Blank rows = not tested)  LOWER EXTREMITY MMT:  MMT Right 03/28/2022 Left 03/28/2022  Hip flexion    Hip extension    Hip abduction    Hip adduction    Hip internal rotation    Hip external rotation    Knee flexion    Knee extension    Ankle dorsiflexion 5/5 Not tested due to surgery  Ankle plantarflexion  2+/5 (non weight bearing resisted static testing)  Ankle inversion 5/5 5/5  Ankle eversion 5/5 4/5   (Blank rows = not tested)  LOWER EXTREMITY SPECIAL TESTS:  03/28/2022 No specific testing  FUNCTIONAL TESTS:  03/28/2022 Lt SLS: WB restriction in boot   GAIT: 03/28/2022 Ambulation c cam walker boot on Lt leg without assistive device in UE.    TODAY'S TREATMENT                                                                          DATE: 03/28/2022 Therex:    HEP instruction/performance c cues for techniques, handout provided.  Trial set performed of each for comprehension and symptom assessment.  See below for exercise list  PATIENT EDUCATION:  Education details: HEP, POC Person educated: Patient Education method: Explanation, Demonstration, Verbal cues, and Handouts Education comprehension: verbalized understanding, returned demonstration, and verbal cues required  HOME EXERCISE PROGRAM: Access Code: [email or web address] URL: [email or web address] Date: 03/28/2022 Prepared by: Scot Jun  Exercises - Isometric Ankle Eversion at Wall  - 2-3 x daily - 7 x weekly - 1 sets - 10 reps - 5-10 hold - Isometric Ankle Dorsiflexion and Plantarflexion  - 2-3 x daily - 7 x weekly - 1 sets - 10 reps - 5-10 hold - Isometric Ankle Inversion at Wall (Mirrored)  - 2-3 x daily - 7 x weekly - 1 sets - 10 reps - 5-10 hold - Long Sitting Isometric Ankle Plantarflexion with Ball at [employer]  - 2-3 x daily - 7 x weekly - 1 sets - 10 reps - 5-10 hold - Seated Gastroc Stretch with Strap  (Mirrored)  - 2-3 x daily - 7 x weekly - 1 sets - 5 reps - 30 hold - Seated Straight Leg Heel Taps  - 1-2 x daily - 7 x weekly - 2-3 sets - 10-15 reps  ASSESSMENT:  CLINICAL  IMPRESSION: Patient is a 66 y.o. who comes to clinic with complaints of Lt lower leg pain s/p recent tibial tendon reconstruction on 02/23/2022 with mobility, strength and movement coordination deficits that impair their ability to perform usual daily and recreational functional activities without increase difficulty/symptoms at this time.  Patient to benefit from skilled PT services to address impairments and limitations to improve to previous level of function without restriction secondary to condition.   OBJECTIVE IMPAIRMENTS: Abnormal gait, decreased activity tolerance, decreased balance, decreased coordination, decreased endurance, decreased mobility, difficulty walking, decreased ROM, decreased strength, impaired perceived functional ability, impaired flexibility, improper body mechanics, and pain.   ACTIVITY LIMITATIONS: carrying, lifting, standing, squatting, stairs, transfers, and locomotion level  PARTICIPATION LIMITATIONS: interpersonal relationship, shopping, community activity, occupation, yard work, and exercise activity  PERSONAL FACTORS:  no specific factors  are also affecting patient's functional outcome.   REHAB POTENTIAL: Good  CLINICAL DECISION MAKING: Stable/uncomplicated  EVALUATION COMPLEXITY: Low   GOALS: Goals reviewed with patient? Yes  SHORT TERM GOALS: (target date for Short term goals are 3 weeks 04/18/2022)   1.  Patient will demonstrate independent use of home exercise program to maintain progress from in clinic treatments.  Goal status: New  LONG TERM GOALS: (target dates for all long term goals are 10 weeks  06/06/2022 )   1. Patient will demonstrate/report pain at worst less than or equal to 2/10 to facilitate minimal limitation in daily activity secondary to pain  symptoms.  Goal status: New   2. Patient will demonstrate independent use of home exercise program to facilitate ability to maintain/progress functional gains from skilled physical therapy services.  Goal status: New   3. Patient will demonstrate FOTO outcome > or = 79 % to indicate reduced disability due to condition.  Goal status: New   4.  Patient will demonstrate Lt LE MMT 5/5 throughout to faciltiate usual transfers, stairs, squatting at [facility] for daily life.   Goal status: New   5.  Patient will demonstrate bilateral SLS > 30 seconds to facilitate stability in ambulation and recreational activity.  Goal status: New   6.  Patient will demonstrate independent ambulation s boot without deviation community distances 500 ft s restriction.  Goal status: New   7.  Patient will demonstrate ascending/descending stairs reciprocally without limitations.  Goal Status: New   PLAN:  PT FREQUENCY: 1-2x/week  PT DURATION: 10 weeks  PLANNED INTERVENTIONS: Therapeutic exercises, Therapeutic activity, Neuro Muscular re-education, Balance training, Gait training, Patient/Family education, Joint mobilization, Stair training, DME instructions, Dry Needling, Electrical stimulation, Traction, Cryotherapy, vasopneumatic deviceMoist heat, Taping, Ultrasound, Ionotophoresis 69m/ml Dexamethasone, and Manual therapy.  All included unless contraindicated  PLAN FOR NEXT SESSION: Review HEP knowledge/results.   Progress strength and WB as apprporiate.    MScot Jun PT, DPT, OCS, ATC 03/28/22  4:40 PM

## 2022-03-30 ENCOUNTER — Other Ambulatory Visit (HOSPITAL_COMMUNITY): Payer: Self-pay

## 2022-04-02 ENCOUNTER — Telehealth: Payer: Self-pay | Admitting: Orthopedic Surgery

## 2022-04-02 NOTE — Telephone Encounter (Signed)
Thank you :)

## 2022-04-02 NOTE — Telephone Encounter (Signed)
Patient wants to know he can come get a new boot because his want pump up. [phone]

## 2022-04-02 NOTE — Telephone Encounter (Signed)
Can you put him on the nurse schedule for tomorrow? Or anytime this week? He will have to sign the tablet for insurance purposes and then we can get him a new boot. Thank you

## 2022-04-03 ENCOUNTER — Ambulatory Visit: Payer: 59

## 2022-04-09 ENCOUNTER — Encounter: Payer: Self-pay | Admitting: Physical Therapy

## 2022-04-09 ENCOUNTER — Ambulatory Visit (INDEPENDENT_AMBULATORY_CARE_PROVIDER_SITE_OTHER): Payer: 59 | Admitting: Physical Therapy

## 2022-04-09 DIAGNOSIS — M79662 Pain in left lower leg: Secondary | ICD-10-CM | POA: Diagnosis not present

## 2022-04-09 DIAGNOSIS — M6281 Muscle weakness (generalized): Secondary | ICD-10-CM

## 2022-04-09 DIAGNOSIS — R262 Difficulty in walking, not elsewhere classified: Secondary | ICD-10-CM | POA: Diagnosis not present

## 2022-04-09 NOTE — Therapy (Signed)
OUTPATIENT PHYSICAL THERAPY TREATMENT NOTE   Patient Name: Robert Hendrix MRN: [id number] DOB:[birth year], 66 y.o., male Today's Date: 04/09/2022  END OF SESSION:   PT End of Session - 04/09/22 1516     Visit Number 2    Number of Visits 20    Date for PT Re-Evaluation 06/06/22    Authorization Type [employer] $25 copay    PT Start Time 1515    PT Stop Time 1545    PT Time Calculation (min) 30 min    Activity Tolerance Patient tolerated treatment well    Behavior During Therapy WFL for tasks assessed/performed             Past Medical History:  Diagnosis Date   Alcohol abuse 2002   Cancer ([place])    basal cell carcinoma of left chest    Chest pain 2017   Drug abuse ([place]) 2002   Hip pain    Hyperglycemia    Hyperlipidemia    on meds   Low back pain    Major depression    hx of   Nasal fracture 1980   Neck pain    OSA on CPAP    Osteoarthritis of thumb    Plantar fasciitis    Prostatitis    Psoriasis    Sleep apnea    OSA uses CPAP-   Thrombosis 2018   LEFT superficial vein thromobsis   Ulnar neuropathy at elbow of left upper extremity 05/04/2019   numbess of fingers   Past Surgical History:  Procedure Laterality Date   COLONOSCOPY  2018   SA-MAC-suprep)ade)-polyps   I & D EXTREMITY N/A 09/21/2017   Procedure: IRRIGATION AND DEBRIDEMENT EXTREMITY;  Surgeon: [name], MD;  Location: [facility];  Service: Orthopedics;  Laterality: N/A;   NOSE SURGERY  1978   Nasal fracture    POLYPECTOMY  2018   polyps   PROSTATE BIOPSY  1990   TENDON REPAIR Left 02/23/2022   Procedure: LEFT ANTERIOR TIBIAL TENDON RECONSTRUCTION;  Surgeon: Newt Minion, MD;  Location: [facility];  Service: Orthopedics;  Laterality: Left;   [place]   Patient Active Problem List   Diagnosis Date Noted   Anterior tibial tendon tear, traumatic 02/23/2022   Weakness of left hand 10/12/2019   Ulnar neuropathy at elbow of left upper extremity 05/04/2019   OSA on CPAP  02/23/2019   Wound of left upper extremity 10/08/2017     THERAPY DIAG:  Pain in left lower leg  Muscle weakness (generalized)  Difficulty in walking, not elsewhere classified  PCP: Shon Baton MD  REFERRING PROVIDER: Newt Minion, MD  REFERRING DIAG: [phone] (ICD-10-CM) - Traumatic rupture of anterior tibial tendon, left, subsequent encounter  EVAL THERAPY DIAG:  Pain in left lower leg  Muscle weakness (generalized)  Difficulty in walking, not elsewhere classified  Rationale for Evaluation and Treatment: Rehabilitation  ONSET DATE: surgery 02/23/2022   SUBJECTIVE:   SUBJECTIVE STATEMENT: Doing well, foot feels really good, pain is minimal   PERTINENT HISTORY: Rt foot pain indicated concurrently.   PAIN:  NPRS scale:  1/10 Pain location: Lt lower leg Pain description: tightness Aggravating factors: nothing specific Relieving factors: nothing specific  PRECAUTIONS: No forced PF  WEIGHT BEARING RESTRICTIONS: NWB  FALLS:  Has patient fallen in last 6 months? 1, no fear of falling  LIVING ENVIRONMENT: Lives in: House/apartment Stairs: 3 to enter house with handrail   OCCUPATION: Chief Financial Officer, has been working.  PLOF: Independent, tennis, biking, snowski trip  PATIENT GOALS: Resume sport activity.    OBJECTIVE:   PATIENT SURVEYS:  03/28/2022 FOTO intake: 69   predicted:  79  COGNITION: 03/28/2022 Overall cognitive status: WFL    SENSATION: 03/28/2022 WFL  POSTURE: 03/28/2022 No Significant postural limitations  PALPATION: 03/28/2022 No specific tenderness noted  LOWER EXTREMITY ROM:   AROM Right 03/28/2022 Left 03/28/2022   Hip flexion     Hip extension     Hip abduction     Hip adduction     Hip internal rotation     Hip external rotation     Knee flexion     Knee extension     Ankle dorsiflexion 6 in 90 deg knee flexion 0 in 90 deg knee flexion 0  Ankle plantarflexion  Not tested due to surgery 57  Ankle inversion 30  15 32  Ankle eversion _0 (Blank rows = not tested)  LOWER EXTREMITY MMT:  MMT Right 03/28/2022 Left 03/28/2022  Hip flexion    Hip extension    Hip abduction    Hip adduction    Hip internal rotation    Hip external rotation    Knee flexion    Knee extension    Ankle dorsiflexion 5/5 Not tested due to surgery  Ankle plantarflexion  2+/5 (non weight bearing resisted static testing)  Ankle inversion 5/5 5/5  Ankle eversion 5/5 4/5   (Blank rows = not tested)  LOWER EXTREMITY SPECIAL TESTS:  03/28/2022 No specific testing  FUNCTIONAL TESTS:  03/28/2022 Lt SLS: WB restriction in boot   GAIT: 03/28/2022 Ambulation c cam walker boot on Lt leg without assistive device in UE.    TODAY'S TREATMENT                                                                           04/09/22 TherEx SLS on compliant surface 3x30 sec; Lt Calf raises bil concentric; Lt eccentric x20 reps Tandem walk x 3 laps at counter Leg Press LLE only 2x10 100#; x10 125# Calf raise on leg press 125# 3x10; bil Calf raises with L3 band x 10 reps each direction (4-way) Heel walking x 3 laps at counter Side shuffle x 3 laps at counter ROM measurements as noted above   DATE: 03/28/2022 Therex:    HEP instruction/performance c cues for techniques, handout provided.  Trial set performed of each for comprehension and symptom assessment.  See below for exercise list  PATIENT EDUCATION:  Education details: HEP, POC Person educated: Patient Education method: Explanation, Demonstration, Verbal cues, and Handouts Education comprehension: verbalized understanding, returned demonstration, and verbal cues required  HOME EXERCISE PROGRAM: Access Code: [email or web address] URL: [email or web address] Date: 03/28/2022 Prepared by: Scot Jun  Exercises - Isometric Ankle Eversion at Wall  - 2-3 x daily - 7 x weekly - 1 sets - 10 reps - 5-10 hold - Isometric Ankle Dorsiflexion and  Plantarflexion  - 2-3 x daily - 7 x weekly - 1 sets - 10 reps - 5-10 hold - Isometric Ankle Inversion at Wall (Mirrored)  - 2-3 x daily - 7 x weekly - 1 sets - 10 reps - 5-10 hold - Long Sitting Isometric Ankle Plantarflexion  with Ball at [facility]  - 2-3 x daily - 7 x weekly - 1 sets - 10 reps - 5-10 hold - Seated Gastroc Stretch with Strap (Mirrored)  - 2-3 x daily - 7 x weekly - 1 sets - 5 reps - 30 hold - Seated Straight Leg Heel Taps  - 1-2 x daily - 7 x weekly - 2-3 sets - 10-15 reps  ASSESSMENT:  CLINICAL IMPRESSION: Pt with good improvement in AROM today and overall progressing well with PT.  He tolerated exercises well today in his shoe, and discussed proper weaning from boot.  Will continue to benefit from PT to maximize function.  OBJECTIVE IMPAIRMENTS: Abnormal gait, decreased activity tolerance, decreased balance, decreased coordination, decreased endurance, decreased mobility, difficulty walking, decreased ROM, decreased strength, impaired perceived functional ability, impaired flexibility, improper body mechanics, and pain.   ACTIVITY LIMITATIONS: carrying, lifting, standing, squatting, stairs, transfers, and locomotion level  PARTICIPATION LIMITATIONS: interpersonal relationship, shopping, community activity, occupation, yard work, and exercise activity  PERSONAL FACTORS: no specific factors are also affecting patient's functional outcome.   REHAB POTENTIAL: Good  CLINICAL DECISION MAKING: Stable/uncomplicated  EVALUATION COMPLEXITY: Low   GOALS: Goals reviewed with patient? Yes  SHORT TERM GOALS: (target date for Short term goals are 3 weeks 04/18/2022)   1.  Patient will demonstrate independent use of home exercise program to maintain progress from in clinic treatments.  Goal status: New  LONG TERM GOALS: (target dates for all long term goals are 10 weeks  06/06/2022 )   1. Patient will demonstrate/report pain at worst less than or equal to 2/10 to facilitate minimal  limitation in daily activity secondary to pain symptoms.  Goal status: New   2. Patient will demonstrate independent use of home exercise program to facilitate ability to maintain/progress functional gains from skilled physical therapy services.  Goal status: New   3. Patient will demonstrate FOTO outcome > or = 79 % to indicate reduced disability due to condition.  Goal status: New   4.  Patient will demonstrate Lt LE MMT 5/5 throughout to faciltiate usual transfers, stairs, squatting at [facility] for daily life.   Goal status: New   5.  Patient will demonstrate bilateral SLS > 30 seconds to facilitate stability in ambulation and recreational activity.  Goal status: New   6.  Patient will demonstrate independent ambulation s boot without deviation community distances 500 ft s restriction.  Goal status: New   7.  Patient will demonstrate ascending/descending stairs reciprocally without limitations.  Goal Status: New   PLAN:  PT FREQUENCY: 1-2x/week  PT DURATION: 10 weeks  PLANNED INTERVENTIONS: Therapeutic exercises, Therapeutic activity, Neuro Muscular re-education, Balance training, Gait training, Patient/Family education, Joint mobilization, Stair training, DME instructions, Dry Needling, Electrical stimulation, Traction, Cryotherapy, vasopneumatic deviceMoist heat, Taping, Ultrasound, Ionotophoresis 21m/ml Dexamethasone, and Manual therapy.  All included unless contraindicated  PLAN FOR NEXT SESSION: Progress strength and WB as apprporiate. SLS/complaint surface activities    SLaureen Abrahams PT, DPT 04/09/22 3:52 PM

## 2022-04-12 ENCOUNTER — Other Ambulatory Visit (HOSPITAL_COMMUNITY): Payer: Self-pay

## 2022-04-19 ENCOUNTER — Encounter: Payer: Self-pay | Admitting: Physical Therapy

## 2022-04-19 ENCOUNTER — Ambulatory Visit (INDEPENDENT_AMBULATORY_CARE_PROVIDER_SITE_OTHER): Payer: 59 | Admitting: Physical Therapy

## 2022-04-19 DIAGNOSIS — M79662 Pain in left lower leg: Secondary | ICD-10-CM | POA: Diagnosis not present

## 2022-04-19 DIAGNOSIS — M6281 Muscle weakness (generalized): Secondary | ICD-10-CM

## 2022-04-19 DIAGNOSIS — R262 Difficulty in walking, not elsewhere classified: Secondary | ICD-10-CM | POA: Diagnosis not present

## 2022-04-19 NOTE — Therapy (Signed)
OUTPATIENT PHYSICAL THERAPY TREATMENT NOTE   Patient Name: Robert Hendrix MRN: [id number] DOB:1956-03-10, 66 y.o., male Today's Date: 04/19/2022  END OF SESSION:   PT End of Session - 04/19/22 1516     Visit Number 3    Number of Visits 20    Date for PT Re-Evaluation 06/06/22    Authorization Type [employer] $25 copay    PT Start Time 1514    PT Stop Time 1545    PT Time Calculation (min) 31 min    Activity Tolerance Patient tolerated treatment well    Behavior During Therapy WFL for tasks assessed/performed              Past Medical History:  Diagnosis Date   Alcohol abuse 2002   Cancer ([place])    basal cell carcinoma of left chest    Chest pain 2017   Drug abuse ([place]) 2002   Hip pain    Hyperglycemia    Hyperlipidemia    on meds   Low back pain    Major depression    hx of   Nasal fracture 1980   Neck pain    OSA on CPAP    Osteoarthritis of thumb    Plantar fasciitis    Prostatitis    Psoriasis    Sleep apnea    OSA uses CPAP-   Thrombosis 2018   LEFT superficial vein thromobsis   Ulnar neuropathy at elbow of left upper extremity 05/04/2019   numbess of fingers   Past Surgical History:  Procedure Laterality Date   COLONOSCOPY  2018   SA-MAC-suprep)ade)-polyps   I & D EXTREMITY N/A 09/21/2017   Procedure: IRRIGATION AND DEBRIDEMENT EXTREMITY;  Surgeon: [name], MD;  Location: [facility];  Service: Orthopedics;  Laterality: N/A;   NOSE SURGERY  1978   Nasal fracture    POLYPECTOMY  2018   polyps   PROSTATE BIOPSY  1990   TENDON REPAIR Left 02/23/2022   Procedure: LEFT ANTERIOR TIBIAL TENDON RECONSTRUCTION;  Surgeon: Newt Minion, MD;  Location: [facility];  Service: Orthopedics;  Laterality: Left;   [place] EXTRACTION  1990   Patient Active Problem List   Diagnosis Date Noted   Anterior tibial tendon tear, traumatic 02/23/2022   Weakness of left hand 10/12/2019   Ulnar neuropathy at elbow of left upper extremity 05/04/2019   OSA on CPAP  02/23/2019   Wound of left upper extremity 10/08/2017     THERAPY DIAG:  Pain in left lower leg  Muscle weakness (generalized)  Difficulty in walking, not elsewhere classified  PCP: Shon Baton MD  REFERRING PROVIDER: Newt Minion, MD  REFERRING DIAG: [phone] (ICD-10-CM) - Traumatic rupture of anterior tibial tendon, left, subsequent encounter  EVAL THERAPY DIAG:  Pain in left lower leg  Muscle weakness (generalized)  Difficulty in walking, not elsewhere classified  Rationale for Evaluation and Treatment: Rehabilitation  ONSET DATE: surgery 02/23/2022   SUBJECTIVE:   SUBJECTIVE STATEMENT: Doing well; no pain in Lt leg, occasional pain on top of Rt foot  PERTINENT HISTORY: Rt foot pain indicated concurrently.   PAIN:  NPRS scale:  0/10 Pain location: Lt lower leg Pain description: tightness Aggravating factors: nothing specific Relieving factors: nothing specific  PRECAUTIONS: No forced PF  WEIGHT BEARING RESTRICTIONS: NWB  FALLS:  Has patient fallen in last 6 months? 1, no fear of falling  LIVING ENVIRONMENT: Lives in: House/apartment Stairs: 3 to enter house with handrail   OCCUPATION: Chief Financial Officer,  has been working.   PLOF: Independent, tennis, biking, snowski trip  PATIENT GOALS: Resume sport activity.    OBJECTIVE:   PATIENT SURVEYS:  03/28/2022 FOTO intake: 69   predicted:  79  COGNITION: 03/28/2022 Overall cognitive status: WFL    SENSATION: 03/28/2022 WFL  POSTURE: 03/28/2022 No Significant postural limitations  PALPATION: 03/28/2022 No specific tenderness noted  LOWER EXTREMITY ROM:   AROM Right 03/28/2022 Left 03/28/2022 Left 04/09/22  Hip flexion     Hip extension     Hip abduction     Hip adduction     Hip internal rotation     Hip external rotation     Knee flexion     Knee extension     Ankle dorsiflexion 6 in 90 deg knee flexion 0 in 90 deg knee flexion 0  Ankle plantarflexion  Not tested due to  surgery 57  Ankle inversion 30 15 32  Ankle eversion _0 (Blank rows = not tested)  LOWER EXTREMITY MMT:  MMT Right 03/28/2022 Left 03/28/2022  Hip flexion    Hip extension    Hip abduction    Hip adduction    Hip internal rotation    Hip external rotation    Knee flexion    Knee extension    Ankle dorsiflexion 5/5 Not tested due to surgery  Ankle plantarflexion  2+/5 (non weight bearing resisted static testing)  Ankle inversion 5/5 5/5  Ankle eversion 5/5 4/5   (Blank rows = not tested)  LOWER EXTREMITY SPECIAL TESTS:  03/28/2022 No specific testing  FUNCTIONAL TESTS:  03/28/2022 Lt SLS: WB restriction in boot   GAIT: 03/28/2022 Ambulation c cam walker boot on Lt leg without assistive device in UE.    TODAY'S TREATMENT                                                                           04/19/22 TherEx Bike x 5 min; L7 On Shuttle (head down) 100# bil jumping, 75# single leg jumping and alternating  Therapeutic Activities Blaze Pods to 3 pods 30 sec x 3 cycles with forward/backward and lateral shuffle to pods, back to start ~ 15' away for simulated tennis activities Overhead serves with tennis racket and badminton birdies  04/09/22 TherEx SLS on compliant surface 3x30 sec; Lt Calf raises bil concentric; Lt eccentric x20 reps Tandem walk x 3 laps at counter Leg Press LLE only 2x10 100#; x10 125# Calf raise on leg press 125# 3x10; bil Calf raises with L3 band x 10 reps each direction (4-way) Heel walking x 3 laps at counter Side shuffle x 3 laps at counter ROM measurements as noted above   DATE: 03/28/2022 Therex:    HEP instruction/performance c cues for techniques, handout provided.  Trial set performed of each for comprehension and symptom assessment.  See below for exercise list  PATIENT EDUCATION:  Education details: HEP, POC Person educated: Patient Education method: Explanation, Demonstration, Verbal cues, and Handouts Education  comprehension: verbalized understanding, returned demonstration, and verbal cues required  HOME EXERCISE PROGRAM: Access Code: [email or web address] URL: [email or web address] Date: 03/28/2022 Prepared by: Scot Jun  Exercises - Isometric Ankle Eversion at Wall  - 2-3 x daily - 7 x  weekly - 1 sets - 10 reps - 5-10 hold - Isometric Ankle Dorsiflexion and Plantarflexion  - 2-3 x daily - 7 x weekly - 1 sets - 10 reps - 5-10 hold - Isometric Ankle Inversion at Wall (Mirrored)  - 2-3 x daily - 7 x weekly - 1 sets - 10 reps - 5-10 hold - Long Sitting Isometric Ankle Plantarflexion with Ball at [employer]  - 2-3 x daily - 7 x weekly - 1 sets - 10 reps - 5-10 hold - Seated Gastroc Stretch with Strap (Mirrored)  - 2-3 x daily - 7 x weekly - 1 sets - 5 reps - 30 hold - Seated Straight Leg Heel Taps  - 1-2 x daily - 7 x weekly - 2-3 sets - 10-15 reps  ASSESSMENT:  CLINICAL IMPRESSION: Pt continues to progress well with PT, and feel he can start lightly playing tennis and easy trails for mountain biking.  Will continue to benefit from PT to maximize function.  OBJECTIVE IMPAIRMENTS: Abnormal gait, decreased activity tolerance, decreased balance, decreased coordination, decreased endurance, decreased mobility, difficulty walking, decreased ROM, decreased strength, impaired perceived functional ability, impaired flexibility, improper body mechanics, and pain.   ACTIVITY LIMITATIONS: carrying, lifting, standing, squatting, stairs, transfers, and locomotion level  PARTICIPATION LIMITATIONS: interpersonal relationship, shopping, community activity, occupation, yard work, and exercise activity  PERSONAL FACTORS: no specific factors are also affecting patient's functional outcome.   REHAB POTENTIAL: Good  CLINICAL DECISION MAKING: Stable/uncomplicated  EVALUATION COMPLEXITY: Low   GOALS: Goals reviewed with patient? Yes  SHORT TERM GOALS: (target date for Short term goals are 3 weeks  04/18/2022)   1.  Patient will demonstrate independent use of home exercise program to maintain progress from in clinic treatments.  Goal status: MET 04/19/22  LONG TERM GOALS: (target dates for all long term goals are 10 weeks  06/06/2022 )   1. Patient will demonstrate/report pain at worst less than or equal to 2/10 to facilitate minimal limitation in daily activity secondary to pain symptoms.  Goal status: New   2. Patient will demonstrate independent use of home exercise program to facilitate ability to maintain/progress functional gains from skilled physical therapy services.  Goal status: New   3. Patient will demonstrate FOTO outcome > or = 79 % to indicate reduced disability due to condition.  Goal status: New   4.  Patient will demonstrate Lt LE MMT 5/5 throughout to faciltiate usual transfers, stairs, squatting at [facility] for daily life.   Goal status: New   5.  Patient will demonstrate bilateral SLS > 30 seconds to facilitate stability in ambulation and recreational activity.  Goal status: New   6.  Patient will demonstrate independent ambulation s boot without deviation community distances 500 ft s restriction.  Goal status: New   7.  Patient will demonstrate ascending/descending stairs reciprocally without limitations.  Goal Status: New   PLAN:  PT FREQUENCY: 1-2x/week  PT DURATION: 10 weeks  PLANNED INTERVENTIONS: Therapeutic exercises, Therapeutic activity, Neuro Muscular re-education, Balance training, Gait training, Patient/Family education, Joint mobilization, Stair training, DME instructions, Dry Needling, Electrical stimulation, Traction, Cryotherapy, vasopneumatic deviceMoist heat, Taping, Ultrasound, Ionotophoresis 42m/ml Dexamethasone, and Manual therapy.  All included unless contraindicated  PLAN FOR NEXT SESSION: see how tennis/mountain biking is going; Progress strength and WB as apprporiate. SLS/complaint surface activities    SLaureen Abrahams  PT, DPT 04/19/22 3:48 PM

## 2022-04-23 ENCOUNTER — Encounter: Payer: 59 | Admitting: Physical Therapy

## 2022-04-25 ENCOUNTER — Ambulatory Visit (INDEPENDENT_AMBULATORY_CARE_PROVIDER_SITE_OTHER): Payer: 59 | Admitting: Physical Therapy

## 2022-04-25 ENCOUNTER — Encounter: Payer: Self-pay | Admitting: Physical Therapy

## 2022-04-25 DIAGNOSIS — M6281 Muscle weakness (generalized): Secondary | ICD-10-CM | POA: Diagnosis not present

## 2022-04-25 DIAGNOSIS — R262 Difficulty in walking, not elsewhere classified: Secondary | ICD-10-CM

## 2022-04-25 DIAGNOSIS — M79662 Pain in left lower leg: Secondary | ICD-10-CM | POA: Diagnosis not present

## 2022-04-25 NOTE — Therapy (Signed)
OUTPATIENT PHYSICAL THERAPY TREATMENT NOTE   Patient Name: Robert Hendrix MRN: [id number] DOB:25-Jul-1955, 65 y.o., male Today's Date: 04/25/2022  END OF SESSION:   PT End of Session - 04/25/22 1643     Visit Number 4    Number of Visits 20    Date for PT Re-Evaluation 06/06/22    Authorization Type [employer] $25 copay    PT Start Time 1555    PT Stop Time 1638    PT Time Calculation (min) 43 min    Activity Tolerance Patient tolerated treatment well    Behavior During Therapy WFL for tasks assessed/performed               Past Medical History:  Diagnosis Date   Alcohol abuse 2002   Cancer ([place])    basal cell carcinoma of left chest    Chest pain 2017   Drug abuse ([place]) 2002   Hip pain    Hyperglycemia    Hyperlipidemia    on meds   Low back pain    Major depression    hx of   Nasal fracture 1980   Neck pain    OSA on CPAP    Osteoarthritis of thumb    Plantar fasciitis    Prostatitis    Psoriasis    Sleep apnea    OSA uses CPAP-   Thrombosis 2018   LEFT superficial vein thromobsis   Ulnar neuropathy at elbow of left upper extremity 05/04/2019   numbess of fingers   Past Surgical History:  Procedure Laterality Date   COLONOSCOPY  2018   SA-MAC-suprep)ade)-polyps   I & D EXTREMITY N/A 09/21/2017   Procedure: IRRIGATION AND DEBRIDEMENT EXTREMITY;  Surgeon: [name], MD;  Location: [facility];  Service: Orthopedics;  Laterality: N/A;   NOSE SURGERY  1978   Nasal fracture    POLYPECTOMY  2018   polyps   PROSTATE BIOPSY  1990   TENDON REPAIR Left 02/23/2022   Procedure: LEFT ANTERIOR TIBIAL TENDON RECONSTRUCTION;  Surgeon: Newt Minion, MD;  Location: [facility];  Service: Orthopedics;  Laterality: Left;   [place]   Patient Active Problem List   Diagnosis Date Noted   Anterior tibial tendon tear, traumatic 02/23/2022   Weakness of left hand 10/12/2019   Ulnar neuropathy at elbow of left upper extremity 05/04/2019   OSA on CPAP  02/23/2019   Wound of left upper extremity 10/08/2017     THERAPY DIAG:  Pain in left lower leg  Muscle weakness (generalized)  Difficulty in walking, not elsewhere classified  PCP: Shon Baton MD  REFERRING PROVIDER: Newt Minion, MD  REFERRING DIAG: [phone] (ICD-10-CM) - Traumatic rupture of anterior tibial tendon, left, subsequent encounter  EVAL THERAPY DIAG:  Pain in left lower leg  Muscle weakness (generalized)  Difficulty in walking, not elsewhere classified  Rationale for Evaluation and Treatment: Rehabilitation  ONSET DATE: surgery 02/23/2022   SUBJECTIVE:   SUBJECTIVE STATEMENT: Doing good, he went mountain biking this weekend without any pain, soreness or difficulty in his left foot. He does get some pain in his Rt foot at time.   PERTINENT HISTORY: Rt foot pain indicated concurrently.   PAIN:  NPRS scale:  0/10 Pain location: Lt lower leg Pain description: tightness Aggravating factors: nothing specific Relieving factors: nothing specific  PRECAUTIONS: No forced PF  WEIGHT BEARING RESTRICTIONS: NWB  FALLS:  Has patient fallen in last 6 months? 1, no fear of falling  LIVING ENVIRONMENT: Lives in: House/apartment Stairs: 3 to enter house with handrail   OCCUPATION: Chief Financial Officer, has been working.   PLOF: Independent, tennis, biking, snowski trip  PATIENT GOALS: Resume sport activity.    OBJECTIVE:   PATIENT SURVEYS:  03/28/2022 FOTO intake: 69   predicted:  79  COGNITION: 03/28/2022 Overall cognitive status: WFL    SENSATION: 03/28/2022 WFL  POSTURE: 03/28/2022 No Significant postural limitations  PALPATION: 03/28/2022 No specific tenderness noted  LOWER EXTREMITY ROM:   AROM Right 03/28/2022 Left 03/28/2022 Left 04/09/22 Left 04/25/22  Hip flexion      Hip extension      Hip abduction      Hip adduction      Hip internal rotation      Hip external rotation      Knee flexion      Knee extension      Ankle  dorsiflexion 6 in 90 deg knee flexion 0 in 90 deg knee flexion 0 5  Ankle plantarflexion  Not tested due to surgery 57 50, missing 10 degrees from Rt side  Ankle inversion 30 15 32   Ankle eversion _0 (Blank rows = not tested)  LOWER EXTREMITY MMT:  MMT Right 03/28/2022 Left 03/28/2022  Hip flexion    Hip extension    Hip abduction    Hip adduction    Hip internal rotation    Hip external rotation    Knee flexion    Knee extension    Ankle dorsiflexion 5/5 Not tested due to surgery  Ankle plantarflexion  2+/5 (non weight bearing resisted static testing)  Ankle inversion 5/5 5/5  Ankle eversion 5/5 4/5   (Blank rows = not tested)  LOWER EXTREMITY SPECIAL TESTS:  03/28/2022 No specific testing  FUNCTIONAL TESTS:  03/28/2022 Lt SLS: WB restriction in boot   GAIT: 03/28/2022 Ambulation c cam walker boot on Lt leg without assistive device in UE.    TODAY'S TREATMENT                                                                           04/25/22 TherEx Bike x 5 min; L8 Slantboard stretch 3 X 30 sec gastroc, 3 X 30 seconds soleus On Shuttle back flat 100# bil jumping 2X10, 75# single leg jumping and alternating 2X10 each side Rocker board black, X 10 PF DF, lateral holding 5 sec each Ankle PF stretch gentle 30 sec X 2 on Lt  Therapeutic Activities Blaze Pods to 3 pods 30 sec x 2 cycles with forward/backward and lateral shuffle to pods, back to start ~ 15' away for simulated tennis activities Blaze pods 3 pods 30 sec SLS on foam X 2 for Lt LE, X 1 for Rt Walking up/down ramp holding 25# X 3 fwd and X 3 lateral facing each side Toe walking 3 round trips at counter top Heel waking 3 round trips at counter top Tandem walk 5 round trips in bars Step down toe taps from 6 inch step leaving left heel down on step tapping down with Rt foot, 2X10 Braided/grapevine jogging 50 feet X 2  04/19/22 TherEx Bike x 5 min; L7 On Shuttle (head down) 100# bil jumping, 75#  single leg jumping and alternating  Therapeutic Activities Blaze Pods to 3 pods 30 sec x 3 cycles with forward/backward and lateral shuffle to pods, back to start ~ 15' away for simulated tennis activities Overhead serves with tennis racket and badminton birdies  04/09/22 TherEx SLS on compliant surface 3x30 sec; Lt Calf raises bil concentric; Lt eccentric x20 reps Tandem walk x 3 laps at counter Leg Press LLE only 2x10 100#; x10 125# Calf raise on leg press 125# 3x10; bil Calf raises with L3 band x 10 reps each direction (4-way) Heel walking x 3 laps at counter Side shuffle x 3 laps at counter ROM measurements as noted above   PATIENT EDUCATION:  Education details: HEP, POC Person educated: Patient Education method: Explanation, Demonstration, Verbal cues, and Handouts Education comprehension: verbalized understanding, returned demonstration, and verbal cues required  HOME EXERCISE PROGRAM: Access Code: [email or web address] URL: [email or web address] Date: 03/28/2022 Prepared by: Scot Jun  Exercises - Isometric Ankle Eversion at Wall  - 2-3 x daily - 7 x weekly - 1 sets - 10 reps - 5-10 hold - Isometric Ankle Dorsiflexion and Plantarflexion  - 2-3 x daily - 7 x weekly - 1 sets - 10 reps - 5-10 hold - Isometric Ankle Inversion at Wall (Mirrored)  - 2-3 x daily - 7 x weekly - 1 sets - 10 reps - 5-10 hold - Long Sitting Isometric Ankle Plantarflexion with Ball at [employer]  - 2-3 x daily - 7 x weekly - 1 sets - 10 reps - 5-10 hold - Seated Gastroc Stretch with Strap (Mirrored)  - 2-3 x daily - 7 x weekly - 1 sets - 5 reps - 30 hold - Seated Straight Leg Heel Taps  - 1-2 x daily - 7 x weekly - 2-3 sets - 10-15 reps  ASSESSMENT:  CLINICAL IMPRESSION: He is progressing extremely well and fast with PT, added more dynamic balance challenges today and ankle stability exercises. He does lack a little PF ROM compared to Rt side so I showed him ankle PF stretch he should do gently  to add in at home.   OBJECTIVE IMPAIRMENTS: Abnormal gait, decreased activity tolerance, decreased balance, decreased coordination, decreased endurance, decreased mobility, difficulty walking, decreased ROM, decreased strength, impaired perceived functional ability, impaired flexibility, improper body mechanics, and pain.   ACTIVITY LIMITATIONS: carrying, lifting, standing, squatting, stairs, transfers, and locomotion level  PARTICIPATION LIMITATIONS: interpersonal relationship, shopping, community activity, occupation, yard work, and exercise activity  PERSONAL FACTORS: no specific factors are also affecting patient's functional outcome.   REHAB POTENTIAL: Good  CLINICAL DECISION MAKING: Stable/uncomplicated  EVALUATION COMPLEXITY: Low   GOALS: Goals reviewed with patient? Yes  SHORT TERM GOALS: (target date for Short term goals are 3 weeks 04/18/2022)   1.  Patient will demonstrate independent use of home exercise program to maintain progress from in clinic treatments.  Goal status: MET 04/19/22  LONG TERM GOALS: (target dates for all long term goals are 10 weeks  06/06/2022 )   1. Patient will demonstrate/report pain at worst less than or equal to 2/10 to facilitate minimal limitation in daily activity secondary to pain symptoms.  Goal status: New   2. Patient will demonstrate independent use of home exercise program to facilitate ability to maintain/progress functional gains from skilled physical therapy services.  Goal status: New   3. Patient will demonstrate FOTO outcome > or = 79 % to indicate reduced disability due to condition.  Goal status: New   4.  Patient will demonstrate  Lt LE MMT 5/5 throughout to faciltiate usual transfers, stairs, squatting at [facility] for daily life.   Goal status: New   5.  Patient will demonstrate bilateral SLS > 30 seconds to facilitate stability in ambulation and recreational activity.  Goal status: New   6.  Patient will demonstrate  independent ambulation s boot without deviation community distances 500 ft s restriction.  Goal status: New   7.  Patient will demonstrate ascending/descending stairs reciprocally without limitations.  Goal Status: New   PLAN:  PT FREQUENCY: 1-2x/week  PT DURATION: 10 weeks  PLANNED INTERVENTIONS: Therapeutic exercises, Therapeutic activity, Neuro Muscular re-education, Balance training, Gait training, Patient/Family education, Joint mobilization, Stair training, DME instructions, Dry Needling, Electrical stimulation, Traction, Cryotherapy, vasopneumatic deviceMoist heat, Taping, Ultrasound, Ionotophoresis 77m/ml Dexamethasone, and Manual therapy.  All included unless contraindicated  PLAN FOR NEXT SESSION: see how tennis/mountain biking is going; Progress strength and WB as apprporiate. SLS/complaint surface activities  BElsie Ra PT, DPT 04/25/22 4:44 PM

## 2022-04-30 ENCOUNTER — Encounter: Payer: 59 | Admitting: Rehabilitative and Restorative Service Providers"

## 2022-05-02 ENCOUNTER — Other Ambulatory Visit (HOSPITAL_COMMUNITY): Payer: Self-pay

## 2022-05-02 MED ORDER — CEFDINIR 300 MG PO CAPS
300.0000 mg | ORAL_CAPSULE | Freq: Two times a day (BID) | ORAL | 0 refills | Status: DC
  Filled 2022-05-02: qty 14, 7d supply, fill #0

## 2022-05-02 MED ORDER — PREDNISONE 20 MG PO TABS
ORAL_TABLET | ORAL | 0 refills | Status: AC
  Filled 2022-05-02: qty 9, 6d supply, fill #0

## 2022-05-02 MED ORDER — OSELTAMIVIR PHOSPHATE 75 MG PO CAPS
75.0000 mg | ORAL_CAPSULE | Freq: Two times a day (BID) | ORAL | 0 refills | Status: DC
  Filled 2022-05-02: qty 10, 5d supply, fill #0

## 2022-05-02 MED ORDER — BENZONATATE 200 MG PO CAPS
200.0000 mg | ORAL_CAPSULE | Freq: Three times a day (TID) | ORAL | 0 refills | Status: DC
  Filled 2022-05-02: qty 42, 14d supply, fill #0

## 2022-05-03 ENCOUNTER — Encounter: Payer: 59 | Admitting: Rehabilitative and Restorative Service Providers"

## 2022-05-09 ENCOUNTER — Other Ambulatory Visit (HOSPITAL_COMMUNITY): Payer: Self-pay

## 2022-05-09 ENCOUNTER — Encounter: Payer: 59 | Admitting: Rehabilitative and Restorative Service Providers"

## 2022-05-11 ENCOUNTER — Encounter: Payer: 59 | Admitting: Physical Therapy

## 2022-05-15 ENCOUNTER — Ambulatory Visit (INDEPENDENT_AMBULATORY_CARE_PROVIDER_SITE_OTHER): Payer: Self-pay | Admitting: Rehabilitative and Restorative Service Providers"

## 2022-05-15 ENCOUNTER — Encounter: Payer: Self-pay | Admitting: Rehabilitative and Restorative Service Providers"

## 2022-05-15 DIAGNOSIS — M79662 Pain in left lower leg: Secondary | ICD-10-CM

## 2022-05-15 DIAGNOSIS — M6281 Muscle weakness (generalized): Secondary | ICD-10-CM

## 2022-05-15 DIAGNOSIS — R262 Difficulty in walking, not elsewhere classified: Secondary | ICD-10-CM

## 2022-05-15 NOTE — Therapy (Signed)
OUTPATIENT PHYSICAL THERAPY TREATMENT NOTE/DISCHARGE   Patient Name: Robert Hendrix MRN: [id number] DOB:07-Dec-1955, 67 y.o., male Today's Date: 05/15/2022  PHYSICAL THERAPY DISCHARGE SUMMARY  Visits from Start of Care: 5  Current functional level related to goals / functional outcomes: See note   Remaining deficits: See note   Education / Equipment: HEP  Patient goals were met. Patient is being discharged due to meeting the stated rehab goals.   END OF SESSION:   PT End of Session - 05/15/22 1550     Visit Number 5    Number of Visits 20    Date for PT Re-Evaluation 06/06/22    Authorization Type [employer] $25 copay    PT Start Time 1552    PT Stop Time 1608    PT Time Calculation (min) 16 min    Activity Tolerance Patient tolerated treatment well    Behavior During Therapy WFL for tasks assessed/performed                Past Medical History:  Diagnosis Date   Alcohol abuse 2002   Cancer ([place])    basal cell carcinoma of left chest    Chest pain 2017   Drug abuse ([place]) 2002   Hip pain    Hyperglycemia    Hyperlipidemia    on meds   Low back pain    Major depression    hx of   Nasal fracture 1980   Neck pain    OSA on CPAP    Osteoarthritis of thumb    Plantar fasciitis    Prostatitis    Psoriasis    Sleep apnea    OSA uses CPAP-   Thrombosis 2018   LEFT superficial vein thromobsis   Ulnar neuropathy at elbow of left upper extremity 05/04/2019   numbess of fingers   Past Surgical History:  Procedure Laterality Date   COLONOSCOPY  2018   SA-MAC-suprep)ade)-polyps   I & D EXTREMITY N/A 09/21/2017   Procedure: IRRIGATION AND DEBRIDEMENT EXTREMITY;  Surgeon: [name], MD;  Location: [facility];  Service: Orthopedics;  Laterality: N/A;   NOSE SURGERY  1978   Nasal fracture    POLYPECTOMY  2018   polyps   PROSTATE BIOPSY  1990   TENDON REPAIR Left 02/23/2022   Procedure: LEFT ANTERIOR TIBIAL TENDON RECONSTRUCTION;  Surgeon: Newt Minion, MD;   Location: [place];  Service: Orthopedics;  Laterality: Left;   [place]   Patient Active Problem List   Diagnosis Date Noted   Anterior tibial tendon tear, traumatic 02/23/2022   Weakness of left hand 10/12/2019   Ulnar neuropathy at elbow of left upper extremity 05/04/2019   OSA on CPAP 02/23/2019   Wound of left upper extremity 10/08/2017     THERAPY DIAG:  Pain in left lower leg  Muscle weakness (generalized)  Difficulty in walking, not elsewhere classified  PCP: Shon Baton MD  REFERRING PROVIDER: Newt Minion, MD  REFERRING DIAG: [phone] (ICD-10-CM) - Traumatic rupture of anterior tibial tendon, left, subsequent encounter  EVAL THERAPY DIAG:  Pain in left lower leg  Muscle weakness (generalized)  Difficulty in walking, not elsewhere classified  Rationale for Evaluation and Treatment: Rehabilitation  ONSET DATE: surgery 02/23/2022   SUBJECTIVE:   SUBJECTIVE STATEMENT: Doing good, he went mountain biking this weekend without any pain, soreness or difficulty in his left foot. He does get some pain in his Rt foot at time.   PERTINENT HISTORY: Rt  foot pain indicated concurrently.   PAIN:  NPRS scale:  0/10 Pain location: Lt lower leg Pain description: tightness Aggravating factors: nothing specific Relieving factors: nothing specific  PRECAUTIONS: No forced PF  WEIGHT BEARING RESTRICTIONS: NWB  FALLS:  Has patient fallen in last 6 months? 1, no fear of falling  LIVING ENVIRONMENT: Lives in: House/apartment Stairs: 3 to enter house with handrail   OCCUPATION: Chief Financial Officer, has been working.   PLOF: Independent, tennis, biking, snowski trip  PATIENT GOALS: Resume sport activity.    OBJECTIVE:   PATIENT SURVEYS:  03/28/2022 FOTO intake: 69   predicted:  79  COGNITION: 03/28/2022 Overall cognitive status: WFL    SENSATION: 03/28/2022 WFL  POSTURE: 03/28/2022 No Significant postural  limitations  PALPATION: 03/28/2022 No specific tenderness noted  LOWER EXTREMITY ROM:   AROM Right 03/28/2022 Left 03/28/2022 Left 04/09/22 Left 04/25/22 Left 05/15/2022  Hip flexion       Hip extension       Hip abduction       Hip adduction       Hip internal rotation       Hip external rotation       Knee flexion       Knee extension       Ankle dorsiflexion 6 in 90 deg knee flexion 0 in 90 deg knee flexion 0 5 10 in 90 deg knee flexion  Ankle plantarflexion  Not tested due to surgery 57 50, missing 10 degrees from Rt side   Ankle inversion 30 15 32    Ankle eversion _0 (Blank rows = not tested)  LOWER EXTREMITY MMT:  MMT Right 03/28/2022 Left 03/28/2022 Left 05/15/2022  Hip flexion     Hip extension     Hip abduction     Hip adduction     Hip internal rotation     Hip external rotation     Knee flexion     Knee extension     Ankle dorsiflexion 5/5 Not tested due to surgery 5/5  Ankle plantarflexion  2+/5 (non weight bearing resisted static testing) 5/5  Ankle inversion 5/5 5/5 5/5  Ankle eversion 5/5 4/5 5/5   (Blank rows = not tested)  LOWER EXTREMITY SPECIAL TESTS:  03/28/2022 No specific testing  FUNCTIONAL TESTS:  05/15/2022:  03/28/2022 Lt SLS: WB restriction in boot   GAIT: 03/28/2022 Ambulation c cam walker boot on Lt leg without assistive device in UE.    TODAY'S TREATMENT                                                                           05/15/2022: TherEx: Recumbent Bike 6 mins Lt leg SL PF x 20 SLS 30 sec Lt Review of existing HEP program verbally for discharge.    04/25/22 TherEx Bike x 5 min; L8 Slantboard stretch 3 X 30 sec gastroc, 3 X 30 seconds soleus On Shuttle back flat 100# bil jumping 2X10, 75# single leg jumping and alternating 2X10 each side Rocker board black, X 10 PF DF, lateral holding 5 sec each Ankle PF stretch gentle 30 sec X 2 on Lt  Therapeutic Activities Blaze Pods to 3 pods 30 sec x 2  cycles with forward/backward and lateral shuffle to pods, back to start ~ 15' away for simulated tennis activities Blaze pods 3 pods 30 sec SLS on foam X 2 for Lt LE, X 1 for Rt Walking up/down ramp holding 25# X 3 fwd and X 3 lateral facing each side Toe walking 3 round trips at counter top Heel waking 3 round trips at counter top Tandem walk 5 round trips in bars Step down toe taps from 6 inch step leaving left heel down on step tapping down with Rt foot, 2X10 Braided/grapevine jogging 50 feet X 2  04/19/22 TherEx Bike x 5 min; L7 On Shuttle (head down) 100# bil jumping, 75# single leg jumping and alternating  Therapeutic Activities Blaze Pods to 3 pods 30 sec x 3 cycles with forward/backward and lateral shuffle to pods, back to start ~ 15' away for simulated tennis activities Overhead serves with tennis racket and badminton birdies    PATIENT EDUCATION:  Education details: HEP, POC Person educated: Patient Education method: Consulting civil engineer, Demonstration, Verbal cues, and Handouts Education comprehension: verbalized understanding, returned demonstration, and verbal cues required  HOME EXERCISE PROGRAM: Access Code: [email or web address] URL: [email or web address] Date: 03/28/2022 Prepared by: Scot Jun  Exercises - Isometric Ankle Eversion at Wall  - 2-3 x daily - 7 x weekly - 1 sets - 10 reps - 5-10 hold - Isometric Ankle Dorsiflexion and Plantarflexion  - 2-3 x daily - 7 x weekly - 1 sets - 10 reps - 5-10 hold - Isometric Ankle Inversion at Wall (Mirrored)  - 2-3 x daily - 7 x weekly - 1 sets - 10 reps - 5-10 hold - Long Sitting Isometric Ankle Plantarflexion with Ball at [employer]  - 2-3 x daily - 7 x weekly - 1 sets - 10 reps - 5-10 hold - Seated Gastroc Stretch with Strap (Mirrored)  - 2-3 x daily - 7 x weekly - 1 sets - 5 reps - 30 hold - Seated Straight Leg Heel Taps  - 1-2 x daily - 7 x weekly - 2-3 sets - 10-15 reps  ASSESSMENT:  CLINICAL IMPRESSION: Pt presented  reporting full return to recreational activity (running, biking, etc).  Due to overall presentation of no pain and good objective data reporting, recommend discharge to HEP at this time.  Pt was in agreement.   OBJECTIVE IMPAIRMENTS: Abnormal gait, decreased activity tolerance, decreased balance, decreased coordination, decreased endurance, decreased mobility, difficulty walking, decreased ROM, decreased strength, impaired perceived functional ability, impaired flexibility, improper body mechanics, and pain.   ACTIVITY LIMITATIONS: carrying, lifting, standing, squatting, stairs, transfers, and locomotion level  PARTICIPATION LIMITATIONS: interpersonal relationship, shopping, community activity, occupation, yard work, and exercise activity  PERSONAL FACTORS: no specific factors are also affecting patient's functional outcome.   REHAB POTENTIAL: Good  CLINICAL DECISION MAKING: Stable/uncomplicated  EVALUATION COMPLEXITY: Low   GOALS: Goals reviewed with patient? Yes  SHORT TERM GOALS: (target date for Short term goals are 3 weeks 04/18/2022)   1.  Patient will demonstrate independent use of home exercise program to maintain progress from in clinic treatments.  Goal status: MET 04/19/22  LONG TERM GOALS: (target dates for all long term goals are 10 weeks  06/06/2022 )   1. Patient will demonstrate/report pain at worst less than or equal to 2/10 to facilitate minimal limitation in daily activity secondary to pain symptoms.  Goal status: Met - 05/15/2022   2. Patient will demonstrate independent use of home exercise program to facilitate ability to maintain/progress functional gains from skilled  physical therapy services.  Goal status: Met - 05/15/2022   3. Patient will demonstrate FOTO outcome > or = 79 % to indicate reduced disability due to condition.  Goal status: Met - 05/15/2022   4.  Patient will demonstrate Lt LE MMT 5/5 throughout to faciltiate usual transfers, stairs, squatting at  [facility] for daily life.   Goal status: Met - 05/15/2022   5.  Patient will demonstrate bilateral SLS > 30 seconds to facilitate stability in ambulation and recreational activity.  Goal status: Met - 05/15/2022   6.  Patient will demonstrate independent ambulation s boot without deviation community distances 500 ft s restriction.  Goal status: Met - 05/15/2022   7.  Patient will demonstrate ascending/descending stairs reciprocally without limitations.  Goal Status: Met - 05/15/2022   PLAN:  PT FREQUENCY: 1-2x/week  PT DURATION: 10 weeks  PLANNED INTERVENTIONS: Therapeutic exercises, Therapeutic activity, Neuro Muscular re-education, Balance training, Gait training, Patient/Family education, Joint mobilization, Stair training, DME instructions, Dry Needling, Electrical stimulation, Traction, Cryotherapy, vasopneumatic deviceMoist heat, Taping, Ultrasound, Ionotophoresis 72m/ml Dexamethasone, and Manual therapy.  All included unless contraindicated  PLAN FOR NEXT SESSION: Discharge  MScot Jun PT, DPT, OCS, ATC 05/15/22  4:13 PM

## 2022-05-17 ENCOUNTER — Encounter: Payer: Self-pay | Admitting: Rehabilitative and Restorative Service Providers"

## 2022-05-21 ENCOUNTER — Other Ambulatory Visit (HOSPITAL_COMMUNITY): Payer: Self-pay

## 2022-05-21 ENCOUNTER — Encounter: Payer: Self-pay | Admitting: Rehabilitative and Restorative Service Providers"

## 2022-05-23 ENCOUNTER — Encounter: Payer: Self-pay | Admitting: Rehabilitative and Restorative Service Providers"

## 2022-06-04 DIAGNOSIS — R5383 Other fatigue: Secondary | ICD-10-CM | POA: Diagnosis not present

## 2022-06-04 DIAGNOSIS — R739 Hyperglycemia, unspecified: Secondary | ICD-10-CM | POA: Diagnosis not present

## 2022-06-04 DIAGNOSIS — E785 Hyperlipidemia, unspecified: Secondary | ICD-10-CM | POA: Diagnosis not present

## 2022-06-04 DIAGNOSIS — R7989 Other specified abnormal findings of blood chemistry: Secondary | ICD-10-CM | POA: Diagnosis not present

## 2022-06-04 DIAGNOSIS — Z125 Encounter for screening for malignant neoplasm of prostate: Secondary | ICD-10-CM | POA: Diagnosis not present

## 2022-06-11 ENCOUNTER — Other Ambulatory Visit (HOSPITAL_COMMUNITY): Payer: Self-pay

## 2022-06-11 DIAGNOSIS — I2510 Atherosclerotic heart disease of native coronary artery without angina pectoris: Secondary | ICD-10-CM | POA: Diagnosis not present

## 2022-06-11 DIAGNOSIS — R82998 Other abnormal findings in urine: Secondary | ICD-10-CM | POA: Diagnosis not present

## 2022-06-11 DIAGNOSIS — M1990 Unspecified osteoarthritis, unspecified site: Secondary | ICD-10-CM | POA: Diagnosis not present

## 2022-06-11 DIAGNOSIS — R972 Elevated prostate specific antigen [PSA]: Secondary | ICD-10-CM | POA: Diagnosis not present

## 2022-06-11 DIAGNOSIS — N529 Male erectile dysfunction, unspecified: Secondary | ICD-10-CM | POA: Diagnosis not present

## 2022-06-11 DIAGNOSIS — G4733 Obstructive sleep apnea (adult) (pediatric): Secondary | ICD-10-CM | POA: Diagnosis not present

## 2022-06-11 DIAGNOSIS — N401 Enlarged prostate with lower urinary tract symptoms: Secondary | ICD-10-CM | POA: Diagnosis not present

## 2022-06-11 DIAGNOSIS — A6000 Herpesviral infection of urogenital system, unspecified: Secondary | ICD-10-CM | POA: Diagnosis not present

## 2022-06-11 DIAGNOSIS — F325 Major depressive disorder, single episode, in full remission: Secondary | ICD-10-CM | POA: Diagnosis not present

## 2022-06-11 DIAGNOSIS — Z0000 Encounter for general adult medical examination without abnormal findings: Secondary | ICD-10-CM | POA: Diagnosis not present

## 2022-06-11 DIAGNOSIS — R911 Solitary pulmonary nodule: Secondary | ICD-10-CM | POA: Diagnosis not present

## 2022-06-11 MED ORDER — TAMSULOSIN HCL 0.4 MG PO CAPS
0.4000 mg | ORAL_CAPSULE | Freq: Every day | ORAL | 3 refills | Status: DC
  Filled 2022-06-11: qty 30, 30d supply, fill #0
  Filled 2022-07-09: qty 30, 30d supply, fill #1
  Filled 2022-08-06: qty 30, 30d supply, fill #2
  Filled 2022-09-05: qty 30, 30d supply, fill #3

## 2022-06-20 ENCOUNTER — Other Ambulatory Visit (HOSPITAL_COMMUNITY): Payer: Self-pay

## 2022-06-20 MED ORDER — FLUCONAZOLE 100 MG PO TABS
100.0000 mg | ORAL_TABLET | ORAL | 0 refills | Status: DC
  Filled 2022-06-20: qty 8, 16d supply, fill #0

## 2022-07-11 DIAGNOSIS — G4730 Sleep apnea, unspecified: Secondary | ICD-10-CM | POA: Diagnosis not present

## 2022-07-11 DIAGNOSIS — G4733 Obstructive sleep apnea (adult) (pediatric): Secondary | ICD-10-CM | POA: Diagnosis not present

## 2022-08-06 ENCOUNTER — Other Ambulatory Visit (HOSPITAL_COMMUNITY): Payer: Self-pay

## 2022-08-07 ENCOUNTER — Other Ambulatory Visit (HOSPITAL_COMMUNITY): Payer: Self-pay

## 2022-08-07 MED ORDER — VALACYCLOVIR HCL 500 MG PO TABS
500.0000 mg | ORAL_TABLET | Freq: Every day | ORAL | 3 refills | Status: DC
  Filled 2022-08-07: qty 90, 90d supply, fill #0
  Filled 2022-11-11: qty 90, 90d supply, fill #1
  Filled 2023-02-12: qty 90, 90d supply, fill #2
  Filled 2023-05-15: qty 90, 90d supply, fill #3

## 2022-08-08 ENCOUNTER — Other Ambulatory Visit: Payer: Self-pay

## 2022-08-08 ENCOUNTER — Other Ambulatory Visit (HOSPITAL_COMMUNITY): Payer: Self-pay

## 2022-08-09 DIAGNOSIS — G4733 Obstructive sleep apnea (adult) (pediatric): Secondary | ICD-10-CM | POA: Diagnosis not present

## 2022-08-09 DIAGNOSIS — G4730 Sleep apnea, unspecified: Secondary | ICD-10-CM | POA: Diagnosis not present

## 2022-08-16 ENCOUNTER — Other Ambulatory Visit (HOSPITAL_COMMUNITY): Payer: Self-pay

## 2022-08-28 ENCOUNTER — Ambulatory Visit: Payer: Commercial Managed Care - PPO | Admitting: Orthopedic Surgery

## 2022-08-28 ENCOUNTER — Encounter: Payer: Self-pay | Admitting: Orthopedic Surgery

## 2022-08-28 DIAGNOSIS — M76811 Anterior tibial syndrome, right leg: Secondary | ICD-10-CM

## 2022-08-28 NOTE — Progress Notes (Signed)
Office Visit Note   Patient: Robert Hendrix           Date of Birth: 1955/10/31           MRN: [id number] Visit Date: 08/28/2022              Requested by: Creola Corn, MD [address] [place],  [place] [address] PCP: Creola Corn, MD  Chief Complaint  Patient presents with   Right Foot - Pain      HPI: Patient is a 67 year old gentleman who presents with acute insertional anterior tibial tendinitis on the right.  Patient is status post anterior tibial tendon reconstruction on the left.  He states that the left side is completely asymptomatic.  Assessment & Plan: Visit Diagnoses:  1. Anterior tibial tendonitis, right     Plan: Patient was given instructions and demonstrated isometric loading of the anterior tibial tendon.  Discussed the use of Voltaren gel 3-4 times a day.  Patient is provided a ASO while playing tennis to avoid dynamic stress.  Discussed that if he is not making progress with the isometric strengthening he can follow-up with Dr. Madelyn Brunner for shockwave therapy.  Follow-Up Instructions: Return if symptoms worsen or fail to improve.   Ortho Exam  Patient is alert, oriented, no adenopathy, well-dressed, normal affect, normal respiratory effort. Examination patient has a good pulse bilaterally.  The anterior tibial tendon has good strength on the left side status post reconstruction.  Examination on the right side patient has pain at the insertion of the anterior tibial tendon.  There is no palpable defects along the tendon no redness no cellulitis.  Imaging: No results found. No images are attached to the encounter.  Labs: No results found for: "HGBA1C", "ESRSEDRATE", "CRP", "LABURIC", "REPTSTATUS", "GRAMSTAIN", "CULT", "LABORGA"   Lab Results  Component Value Date   ALBUMIN 4.2 02/23/2022    No results found for: "MG" No results found for: "VD25OH"  No results found for: "PREALBUMIN"    Latest Ref Rng & Units 02/23/2022    8:54 AM 08/17/2020     4:16 PM 09/21/2017    1:22 PM  CBC EXTENDED  WBC 4.0 - 10.5 K/uL 5.8  5.8  6.6   RBC 4.22 - 5.81 MIL/uL 5.63  5.23  5.22   Hemoglobin 13.0 - 17.0 g/dL 82.9  56.2  13.0   HCT 39.0 - 52.0 % 47.5  44.4  45.0   Platelets 150 - 400 K/uL 185  179  161   NEUT# 1.7 - 7.7 K/uL  3.3  5.1   Lymph# 0.7 - 4.0 K/uL  1.8  1.0      There is no height or weight on file to calculate BMI.  Orders:  No orders of the defined types were placed in this encounter.  No orders of the defined types were placed in this encounter.    Procedures: No procedures performed  Clinical Data: No additional findings.  ROS:  All other systems negative, except as noted in the HPI. Review of Systems  Objective: Vital Signs: There were no vitals taken for this visit.  Specialty Comments:  No specialty comments available.  PMFS History: Patient Active Problem List   Diagnosis Date Noted   Anterior tibial tendon tear, traumatic 02/23/2022   Weakness of left hand 10/12/2019   Ulnar neuropathy at elbow of left upper extremity 05/04/2019   OSA on CPAP 02/23/2019   Wound of left upper extremity 10/08/2017   Past Medical History:  Diagnosis Date   Alcohol abuse 2002   Cancer    basal cell carcinoma of left chest    Chest pain 2017   Drug abuse 2002   Hip pain    Hyperglycemia    Hyperlipidemia    on meds   Low back pain    Major depression    hx of   Nasal fracture 1980   Neck pain    OSA on CPAP    Osteoarthritis of thumb    Plantar fasciitis    Prostatitis    Psoriasis    Sleep apnea    OSA uses CPAP-   Thrombosis 2018   LEFT superficial vein thromobsis   Ulnar neuropathy at elbow of left upper extremity 05/04/2019   numbess of fingers    Family History  Adopted: Yes  Problem Relation Age of Onset   Rectal cancer Neg Hx    Prostate cancer Neg Hx    Pancreatic cancer Neg Hx    Esophageal cancer Neg Hx    Stomach cancer Neg Hx     Past Surgical History:  Procedure Laterality Date    COLONOSCOPY  2018   SA-MAC-suprep)ade)-polyps   I & D EXTREMITY N/A 09/21/2017   Procedure: IRRIGATION AND DEBRIDEMENT EXTREMITY;  Surgeon: Eldred Manges, MD;  Location: [facility];  Service: Orthopedics;  Laterality: N/A;   NOSE SURGERY  1978   Nasal fracture    POLYPECTOMY  2018   polyps   PROSTATE BIOPSY  1990   TENDON REPAIR Left 02/23/2022   Procedure: LEFT ANTERIOR TIBIAL TENDON RECONSTRUCTION;  Surgeon: Nadara Mustard, MD;  Location[facility];  Service: Orthopedics;  Laterality: Left;   WISDOM TOOTH EXTRACTION  1990   Social History   Occupational History   Not on file  Tobacco Use   Smoking status: Light Smoker    Types: Cigars   Smokeless tobacco: Never   Tobacco comments:    1 cigar daily  Vaping Use   Vaping Use: Never used  Substance and Sexual Activity   Alcohol use: No   Drug use: Not Currently    Comment: 02/22/22- clean 20 yeasr   Sexual activity: Not on file

## 2022-08-30 ENCOUNTER — Other Ambulatory Visit (HOSPITAL_COMMUNITY): Payer: Self-pay

## 2022-08-30 DIAGNOSIS — N481 Balanitis: Secondary | ICD-10-CM | POA: Diagnosis not present

## 2022-08-30 DIAGNOSIS — R972 Elevated prostate specific antigen [PSA]: Secondary | ICD-10-CM | POA: Diagnosis not present

## 2022-08-30 DIAGNOSIS — R3913 Splitting of urinary stream: Secondary | ICD-10-CM | POA: Diagnosis not present

## 2022-08-30 DIAGNOSIS — N401 Enlarged prostate with lower urinary tract symptoms: Secondary | ICD-10-CM | POA: Diagnosis not present

## 2022-08-30 MED ORDER — CLOTRIMAZOLE-BETAMETHASONE 1-0.05 % EX CREA
TOPICAL_CREAM | Freq: Two times a day (BID) | CUTANEOUS | 1 refills | Status: DC
  Filled 2022-08-30: qty 15, 10d supply, fill #0
  Filled 2022-09-05 – 2022-09-07 (×2): qty 15, 10d supply, fill #1

## 2022-09-05 ENCOUNTER — Other Ambulatory Visit: Payer: Self-pay

## 2022-09-09 DIAGNOSIS — G4733 Obstructive sleep apnea (adult) (pediatric): Secondary | ICD-10-CM | POA: Diagnosis not present

## 2022-09-09 DIAGNOSIS — G4730 Sleep apnea, unspecified: Secondary | ICD-10-CM | POA: Diagnosis not present

## 2022-10-11 ENCOUNTER — Other Ambulatory Visit (HOSPITAL_COMMUNITY): Payer: Self-pay

## 2022-10-12 ENCOUNTER — Other Ambulatory Visit (HOSPITAL_COMMUNITY): Payer: Self-pay

## 2022-10-12 MED ORDER — TAMSULOSIN HCL 0.4 MG PO CAPS
0.4000 mg | ORAL_CAPSULE | Freq: Every day | ORAL | 3 refills | Status: DC
  Filled 2022-10-12: qty 90, 90d supply, fill #0
  Filled 2022-12-27: qty 90, 90d supply, fill #1
  Filled 2023-04-09 – 2023-04-15 (×2): qty 90, 90d supply, fill #2

## 2022-10-22 DIAGNOSIS — F4389 Other reactions to severe stress: Secondary | ICD-10-CM | POA: Diagnosis not present

## 2022-10-25 DIAGNOSIS — M79644 Pain in right finger(s): Secondary | ICD-10-CM | POA: Diagnosis not present

## 2022-11-05 NOTE — Progress Notes (Signed)
 Called Dr. Sheryle to review his XR results.  Imaging demonstrates severe arthritis of the index DIP joint and the thumb trapeziometacarpal joint.  There is not an obvious fracture of the small finger at the PIP joint.  There does appear to be an ossicle in the soft tissues on the lateral view but this looks chronic.  He still has decent joint space at the PIP joint.  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  We discussed treatment options for both the index and small fingers (he already receives treatment for his thumb).  He would like to observe both for now which I think is a reasonable option.  He will follow up as needed.  Marsa HERO. Chiaramonti, MD Hand and Upper Extremity Surgery [facility] Department of Orthopaedic Surgery and Rehabilitation [facility] [employer] [facility]  Electronically signed by:  Marsa Ozell Christen, MD 11/05/2022 1:12 PM

## 2022-11-12 ENCOUNTER — Other Ambulatory Visit (HOSPITAL_COMMUNITY): Payer: Self-pay

## 2022-11-14 ENCOUNTER — Other Ambulatory Visit (HOSPITAL_COMMUNITY): Payer: Self-pay

## 2022-11-14 MED ORDER — FLUCONAZOLE 100 MG PO TABS
100.0000 mg | ORAL_TABLET | ORAL | 0 refills | Status: DC
  Filled 2022-11-14: qty 8, 16d supply, fill #0

## 2022-11-23 ENCOUNTER — Other Ambulatory Visit (HOSPITAL_COMMUNITY): Payer: Self-pay

## 2022-11-23 ENCOUNTER — Telehealth: Payer: Commercial Managed Care - PPO | Admitting: Family Medicine

## 2022-11-23 DIAGNOSIS — T63444A Toxic effect of venom of bees, undetermined, initial encounter: Secondary | ICD-10-CM

## 2022-11-23 MED ORDER — PREDNISONE 20 MG PO TABS
20.0000 mg | ORAL_TABLET | Freq: Two times a day (BID) | ORAL | 0 refills | Status: AC
  Filled 2022-11-23: qty 10, 5d supply, fill #0

## 2022-11-23 NOTE — Progress Notes (Signed)
E-Visit for Insect Sting  Thank you for describing the insect sting for [place].  Here is how we plan to help!  A sting that we will treat with a short course of prednisone.  The 2 greatest risks from insect stings are allergic reaction, which can be fatal in some people and infection, which is more common and less serious.  Bees, wasps, yellow jackets, and hornets belong to a class of insects called Hymenoptera.  Most insect stings cause only minor discomfort.  Stings can happen anywhere on the body and can be painful.  Most stings are from honey bees or yellow jackets.  Fire ants can sting multiple times.  The sites of the stings are more likely to become infected.    I have sent in prednisone 40 mg by mouth daily for 5 days to the pharmacy you selected.  Please make sure that you selected a pharmacy that is open now.  What can be used to prevent Insect Stings?  Insect repellant with at least 20% DEET.  Wearing long pants and shirts with socks and shoes.  Wear dark or drab-colored clothes rather than bright colors.  Avoid using perfumes and hair sprays; these attract insects.  HOME CARE ADVICE:  1. Stinger removal: The stinger looks like a tiny black dot in the sting. Use a fingernail, credit card edge, or knife-edge to scrape it off.  Don't pull it out because it squeezes out more venom. If the stinger is below the skin surface, leave it alone.  It will be shed with normal skin healing. 2. Use cold compresses to the area of the sting for 10-20 minutes.  You may repeat this as needed to relieve symptoms of pain and swelling. 3.  For pain relief, take acetominophen 650 mg 4-6 hours as needed or ibuprofen 400 mg every 6-8 hours as needed or naproxen 250-500 mg every 12 hours as needed. 4.  You can also use hydrocortisone cream 0.5% or 1% up to 4 times daily as needed for itching. 5.  If the sting becomes very itchy, take Benadryl 25-50 mg, follow directions on box. 6.  Wash the area 2-3  times daily with antibacterial soap and warm water. 7. Call your Doctor if: Fever, a severe headache, or rash occur in the next 2 weeks. Sting area begins to look infected. Redness and swelling worsens after home treatment. Your current symptoms become worse.    MAKE SURE YOU:  Understand these instructions. Will watch your condition. Will get help right away if you are not doing well or get worse.  Thank you for choosing an e-visit.  Your e-visit answers were reviewed by a board certified advanced clinical practitioner to complete your personal care plan. Depending upon the condition, your plan could have included both over the counter or prescription medications.  Please review your pharmacy choice. Make sure the pharmacy is open so you can pick up prescription now. If there is a problem, you may contact your provider through [employer] and have the prescription routed to another pharmacy.  Your safety is important to [place]. If you have drug allergies check your prescription carefully.   For the next 24 hours you can use MyChart to ask questions about today's visit, request a non-urgent call back, or ask for a work or school excuse. You will get an email in the next two days asking about your experience. I hope that your e-visit has been valuable and will speed your recovery.    have  provided 5 minutes of non face to face time during this encounter for chart review and documentation.

## 2022-11-26 ENCOUNTER — Other Ambulatory Visit (HOSPITAL_COMMUNITY): Payer: Self-pay

## 2022-11-26 MED ORDER — VENLAFAXINE HCL ER 150 MG PO CP24
150.0000 mg | ORAL_CAPSULE | Freq: Every day | ORAL | 3 refills | Status: DC
  Filled 2022-11-26: qty 90, 90d supply, fill #0
  Filled 2023-02-26: qty 90, 90d supply, fill #1
  Filled 2023-05-15: qty 90, 90d supply, fill #2
  Filled 2023-09-03: qty 90, 90d supply, fill #3

## 2022-11-27 ENCOUNTER — Ambulatory Visit: Payer: Commercial Managed Care - PPO | Admitting: Orthopedic Surgery

## 2022-12-03 ENCOUNTER — Other Ambulatory Visit: Payer: Self-pay | Admitting: Oncology

## 2022-12-03 DIAGNOSIS — Z006 Encounter for examination for normal comparison and control in clinical research program: Secondary | ICD-10-CM

## 2022-12-05 ENCOUNTER — Encounter: Payer: Self-pay | Admitting: Family

## 2022-12-05 ENCOUNTER — Other Ambulatory Visit (INDEPENDENT_AMBULATORY_CARE_PROVIDER_SITE_OTHER): Payer: Commercial Managed Care - PPO

## 2022-12-05 ENCOUNTER — Ambulatory Visit: Payer: Commercial Managed Care - PPO | Admitting: Family

## 2022-12-05 DIAGNOSIS — G8929 Other chronic pain: Secondary | ICD-10-CM | POA: Diagnosis not present

## 2022-12-05 DIAGNOSIS — M25561 Pain in right knee: Secondary | ICD-10-CM

## 2022-12-05 DIAGNOSIS — M1711 Unilateral primary osteoarthritis, right knee: Secondary | ICD-10-CM | POA: Diagnosis not present

## 2022-12-05 MED ORDER — LIDOCAINE HCL 1 % IJ SOLN
5.0000 mL | INTRAMUSCULAR | Status: AC | PRN
  Administered 2022-12-05: 5 mL

## 2022-12-05 MED ORDER — METHYLPREDNISOLONE ACETATE 40 MG/ML IJ SUSP
40.0000 mg | INTRAMUSCULAR | Status: AC | PRN
  Administered 2022-12-05: 40 mg via INTRA_ARTICULAR

## 2022-12-05 NOTE — Progress Notes (Signed)
Office Visit Note   Patient: Robert Hendrix           Date of Birth: [birth year]           MRN: [id number] Visit Date: 12/05/2022              Requested by: Creola Corn, MD [address] [place],  [place] [address] PCP: Creola Corn, MD  Chief Complaint  Patient presents with   Right Knee - Pain      HPI: The patient is a 67 year old gentleman who is seen today for evaluation of right knee pain this is chronic however for the last 6 weeks things have been much more uncomfortable for him.  He has pain descending steps pain with flexion he is complaining of some crepitation as well.  Global pain of the knee  Pain when playing tennis pivoting especially painful no giving way  Of note did complete a 2-week course of prednisone for a wasp sting noted that while on the prednisone his knee pain did ease  Assessment & Plan: Visit Diagnoses:  1. Chronic pain of right knee     Plan: Depo-Medrol injection right knee.  Discussed his osteoarthritic changes on x-ray.  He will follow-up as needed.  Follow-Up Instructions: Return in about 4 weeks (around 01/02/2023), or if symptoms worsen or fail to improve.   Right Knee Exam   Tenderness  The patient is experiencing tenderness in the medial joint line.  Range of Motion  The patient has normal right knee ROM.  Tests  Varus: negative Valgus: negative  Other  Erythema: absent Effusion: no effusion present      Patient is alert, oriented, no adenopathy, well-dressed, normal affect, normal respiratory effort.   Imaging: XR Knee 1-2 Views Right  Result Date: 12/05/2022 Radiographs of the right knee show moderate to severe degenerative changes worst in the medial compartment.  This is equal bilaterally.  He does have a posterior loose body on the right.  No images are attached to the encounter.  Labs: No results found for: "HGBA1C", "ESRSEDRATE", "CRP", "LABURIC", "REPTSTATUS", "GRAMSTAIN", "CULT", "LABORGA"   Lab Results   Component Value Date   ALBUMIN 4.2 02/23/2022    No results found for: "MG" No results found for: "VD25OH"  No results found for: "PREALBUMIN"    Latest Ref Rng & Units 02/23/2022    8:54 AM 08/17/2020    4:16 PM 09/21/2017    1:22 PM  CBC EXTENDED  WBC 4.0 - 10.5 K/uL 5.8  5.8  6.6   RBC 4.22 - 5.81 MIL/uL 5.63  5.23  5.22   Hemoglobin 13.0 - 17.0 g/dL 19.1  47.8  29.5   HCT 39.0 - 52.0 % 47.5  44.4  45.0   Platelets 150 - 400 K/uL 185  179  161   NEUT# 1.7 - 7.7 K/uL  3.3  5.1   Lymph# 0.7 - 4.0 K/uL  1.8  1.0      There is no height or weight on file to calculate BMI.  Orders:  Orders Placed This Encounter  Procedures   XR Knee 1-2 Views Right   No orders of the defined types were placed in this encounter.    Procedures: Large Joint Inj: R knee on 12/05/2022 3:52 PM Indications: pain Details: 18 G 1.5 in needle, anteromedial approach Medications: 5 mL lidocaine 1 %; 40 mg methylPREDNISolone acetate 40 MG/ML Consent was given by the patient.      Clinical Data: No additional  findings.  ROS:  All other systems negative, except as noted in the HPI. Review of Systems  Objective: Vital Signs: There were no vitals taken for this visit.  Specialty Comments:  No specialty comments available.  PMFS History: Patient Active Problem List   Diagnosis Date Noted   Anterior tibial tendon tear, traumatic 02/23/2022   Weakness of left hand 10/12/2019   Ulnar neuropathy at elbow of left upper extremity 05/04/2019   OSA on CPAP 02/23/2019   Wound of left upper extremity 10/08/2017   Past Medical History:  Diagnosis Date   Alcohol abuse 2002   Cancer [facility])    basal cell carcinoma of left chest    Chest pain 2017   Drug abuse (HCC) 2002   Hip pain    Hyperglycemia    Hyperlipidemia    on meds   Low back pain    Major depression    hx of   Nasal fracture 1980   Neck pain    OSA on CPAP    Osteoarthritis of thumb    Plantar fasciitis    Prostatitis     Psoriasis    Sleep apnea    OSA uses CPAP-   Thrombosis 2018   LEFT superficial vein thromobsis   Ulnar neuropathy at elbow of left upper extremity 05/04/2019   numbess of fingers    Family History  Adopted: Yes  Problem Relation Age of Onset   Rectal cancer Neg Hx    Prostate cancer Neg Hx    Pancreatic cancer Neg Hx    Esophageal cancer Neg Hx    Stomach cancer Neg Hx     Past Surgical History:  Procedure Laterality Date   COLONOSCOPY  2018   SA-MAC-suprep)ade)-polyps   I & D EXTREMITY N/A 09/21/2017   Procedure: IRRIGATION AND DEBRIDEMENT EXTREMITY;  Surgeon: Eldred Manges, MD;  Location: [facility];  Service: Orthopedics;  Laterality: N/A;   NOSE SURGERY  1978   Nasal fracture    POLYPECTOMY  2018   polyps   PROSTATE BIOPSY  1990   TENDON REPAIR Left 02/23/2022   Procedure: LEFT ANTERIOR TIBIAL TENDON RECONSTRUCTION;  Surgeon: Nadara Mustard, MD;  Location: [facility];  Service: Orthopedics;  Laterality: Left;   WISDOM TOOTH EXTRACTION  1990   Social History   Occupational History   Not on file  Tobacco Use   Smoking status: Light Smoker    Types: Cigars   Smokeless tobacco: Never   Tobacco comments:    1 cigar daily  Vaping Use   Vaping status: Never Used  Substance and Sexual Activity   Alcohol use: No   Drug use: Not Currently    Comment: 02/22/22- clean 20 yeasr   Sexual activity: Not on file

## 2022-12-14 ENCOUNTER — Other Ambulatory Visit (HOSPITAL_COMMUNITY): Payer: Self-pay

## 2022-12-21 ENCOUNTER — Other Ambulatory Visit (HOSPITAL_COMMUNITY): Payer: Self-pay

## 2022-12-21 MED ORDER — CLOTRIMAZOLE-BETAMETHASONE 1-0.05 % EX CREA
1.0000 | TOPICAL_CREAM | Freq: Two times a day (BID) | CUTANEOUS | 1 refills | Status: DC
  Filled 2022-12-21: qty 15, 8d supply, fill #0
  Filled 2023-04-09: qty 15, 8d supply, fill #1
  Filled 2023-04-15: qty 15, 10d supply, fill #1

## 2022-12-22 ENCOUNTER — Telehealth: Payer: Commercial Managed Care - PPO | Admitting: Family Medicine

## 2022-12-22 DIAGNOSIS — N481 Balanitis: Secondary | ICD-10-CM

## 2022-12-22 MED ORDER — FLUCONAZOLE 150 MG PO TABS: 150.0000 mg | ORAL_TABLET | Freq: Once | ORAL | 0 refills | Status: AC

## 2022-12-22 NOTE — Progress Notes (Signed)
Patient is complaining of penile yeast infection and has tried clobetasol cream but was not helpful.  Pt states wife has yeast infection as well. I will send Diflucan.  I will also advise the patient for the future to set up a virtual visit.

## 2022-12-22 NOTE — Addendum Note (Signed)
Addended byReed Pandy on: 12/22/2022 01:49 PM   Modules accepted: Orders

## 2022-12-22 NOTE — Progress Notes (Signed)
Given the answers to the questionnaire, it is unclear the etiology of the condition.  I will recommend that the patient sign up for a virtual video visit or to make their way to the urgent care in person to further discuss symptoms.  I have spent 5 minutes in review of e-visit questionnaire, review and updating patient chart, medical decision making and response to patient.   Reed Pandy, PA-C

## 2022-12-24 ENCOUNTER — Other Ambulatory Visit (HOSPITAL_COMMUNITY): Payer: Self-pay

## 2022-12-26 DIAGNOSIS — Z85828 Personal history of other malignant neoplasm of skin: Secondary | ICD-10-CM | POA: Diagnosis not present

## 2022-12-26 DIAGNOSIS — L821 Other seborrheic keratosis: Secondary | ICD-10-CM | POA: Diagnosis not present

## 2022-12-26 DIAGNOSIS — L814 Other melanin hyperpigmentation: Secondary | ICD-10-CM | POA: Diagnosis not present

## 2022-12-26 DIAGNOSIS — D225 Melanocytic nevi of trunk: Secondary | ICD-10-CM | POA: Diagnosis not present

## 2022-12-26 DIAGNOSIS — M71371 Other bursal cyst, right ankle and foot: Secondary | ICD-10-CM | POA: Diagnosis not present

## 2022-12-26 DIAGNOSIS — Z08 Encounter for follow-up examination after completed treatment for malignant neoplasm: Secondary | ICD-10-CM | POA: Diagnosis not present

## 2022-12-28 ENCOUNTER — Other Ambulatory Visit (HOSPITAL_COMMUNITY)
Admission: RE | Admit: 2022-12-28 | Discharge: 2022-12-28 | Disposition: A | Discharge status: Home / Self Care | Payer: Commercial Managed Care - PPO | Attending: Oncology | Admitting: Oncology

## 2022-12-28 DIAGNOSIS — Z006 Encounter for examination for normal comparison and control in clinical research program: Secondary | ICD-10-CM | POA: Insufficient documentation

## 2023-01-02 ENCOUNTER — Other Ambulatory Visit (HOSPITAL_COMMUNITY): Payer: Self-pay

## 2023-01-03 ENCOUNTER — Other Ambulatory Visit (HOSPITAL_COMMUNITY): Payer: Self-pay

## 2023-01-03 MED ORDER — REPATHA SURECLICK 140 MG/ML ~~LOC~~ SOAJ
140.0000 mg | SUBCUTANEOUS | 3 refills | Status: DC
  Filled 2023-01-03 – 2023-02-26 (×2): qty 2, 42d supply, fill #0
  Filled 2023-02-26: qty 1, 21d supply, fill #0
  Filled 2023-04-30: qty 2, 42d supply, fill #1
  Filled 2023-06-09 – 2023-09-05 (×3): qty 2, 42d supply, fill #2

## 2023-01-07 ENCOUNTER — Other Ambulatory Visit (HOSPITAL_COMMUNITY): Payer: Self-pay

## 2023-01-07 DIAGNOSIS — G4730 Sleep apnea, unspecified: Secondary | ICD-10-CM | POA: Diagnosis not present

## 2023-01-07 DIAGNOSIS — G4733 Obstructive sleep apnea (adult) (pediatric): Secondary | ICD-10-CM | POA: Diagnosis not present

## 2023-01-07 MED ORDER — REPATHA SURECLICK 140 MG/ML ~~LOC~~ SOAJ
140.0000 mg | SUBCUTANEOUS | 3 refills | Status: AC
  Filled 2023-01-07 – 2023-09-05 (×6): qty 6, 84d supply, fill #0
  Filled 2023-11-27: qty 6, 84d supply, fill #1

## 2023-01-07 MED ORDER — FLUCONAZOLE 100 MG PO TABS
100.0000 mg | ORAL_TABLET | ORAL | 0 refills | Status: DC
  Filled 2023-01-07: qty 8, 16d supply, fill #0

## 2023-01-07 MED ORDER — REPATHA SURECLICK 140 MG/ML ~~LOC~~ SOAJ
140.0000 mg | SUBCUTANEOUS | 3 refills | Status: DC
  Filled 2023-01-07: qty 6, 84d supply, fill #0

## 2023-01-09 ENCOUNTER — Other Ambulatory Visit (HOSPITAL_COMMUNITY): Payer: Self-pay

## 2023-01-09 LAB — GENECONNECT MOLECULAR SCREEN: Genetic Analysis Overall Interpretation: NEGATIVE

## 2023-01-11 ENCOUNTER — Other Ambulatory Visit (HOSPITAL_COMMUNITY): Payer: Self-pay

## 2023-01-15 ENCOUNTER — Other Ambulatory Visit (HOSPITAL_COMMUNITY): Payer: Self-pay

## 2023-02-12 ENCOUNTER — Other Ambulatory Visit (HOSPITAL_COMMUNITY): Payer: Self-pay

## 2023-02-26 ENCOUNTER — Other Ambulatory Visit: Payer: Self-pay

## 2023-02-26 ENCOUNTER — Other Ambulatory Visit (HOSPITAL_COMMUNITY): Payer: Self-pay

## 2023-03-03 IMAGING — CT CT ANGIO CHEST
2 of 3 series · 19 of 32 positions shown · IV contrast (omnipaque)
Comparison: None.

CLINICAL DATA: after wearing a boot that was too small. h right
pain.

EXAM:
CT ANGIOGRAPHY CHEST WITH CONTRAST
TECHNIQUE: Multidetector CT imaging of the chest was performed using the
standard protocol during bolus administration of intravenous
contrast. Multiplanar CT image reconstructions and MIPs were
obtained to evaluate the vascular anatomy.
CONTRAST:  100mL OMNIPAQUE IOHEXOL 350 MG/ML SOLN

[Series 6: arterial · axial · arterial · 0.78mm/px · z∈[-603,-329]mm · 11 of 165 slices shown]
[im 14/165  lung]
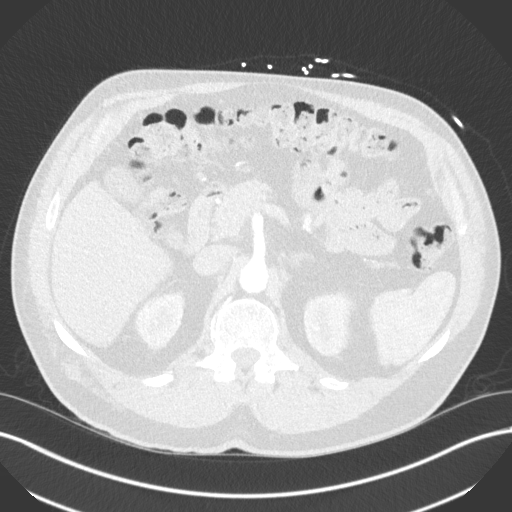
[im 28/165  soft-tissue]
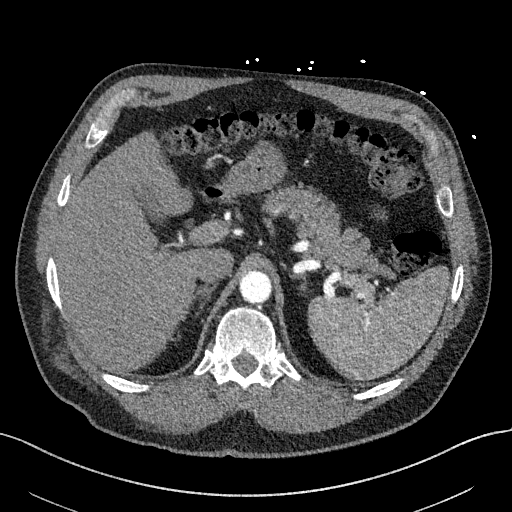
[im 42/165  lung]
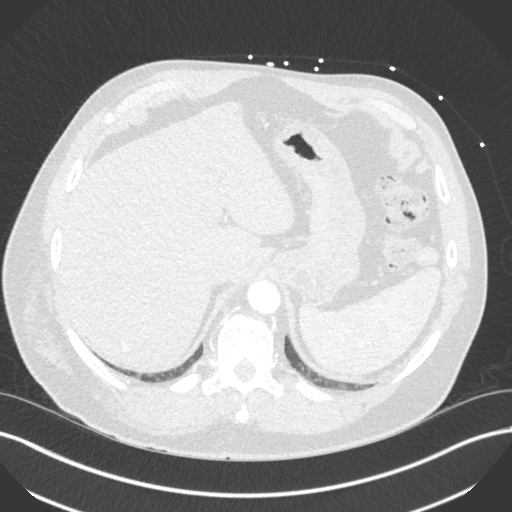
[im 55/165  soft-tissue]
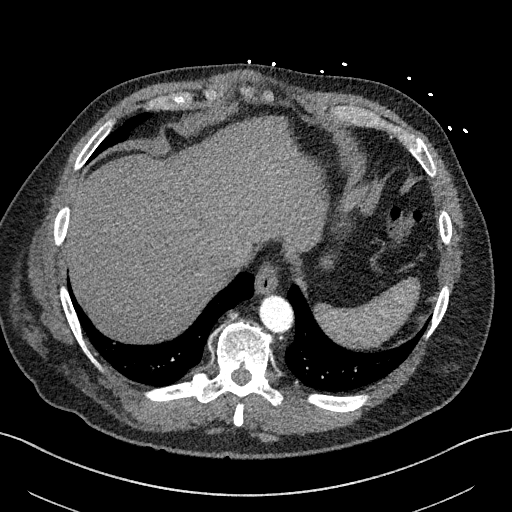
[im 69/165  lung]
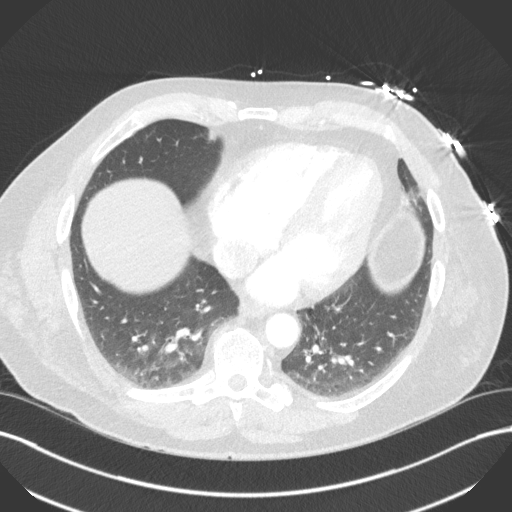
[im 83/165  soft-tissue]
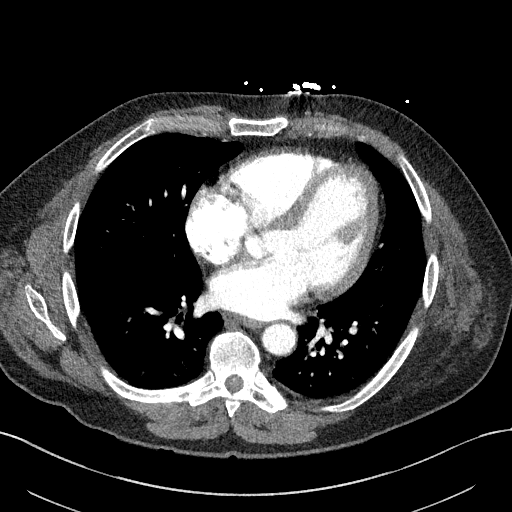
[im 96/165  lung]
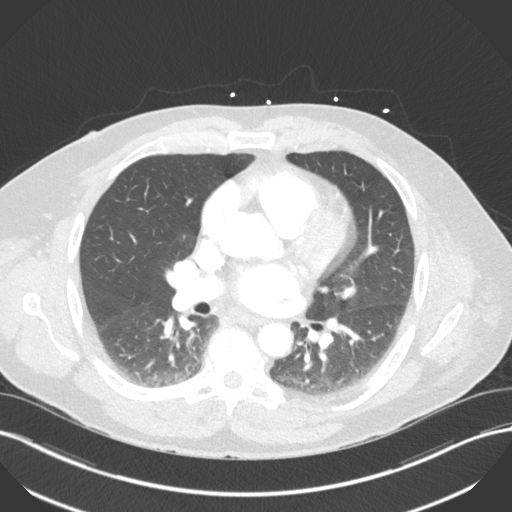
[im 110/165  soft-tissue]
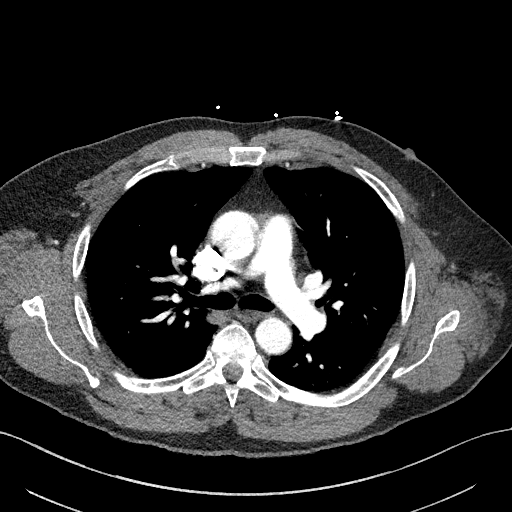
[im 124/165  lung]
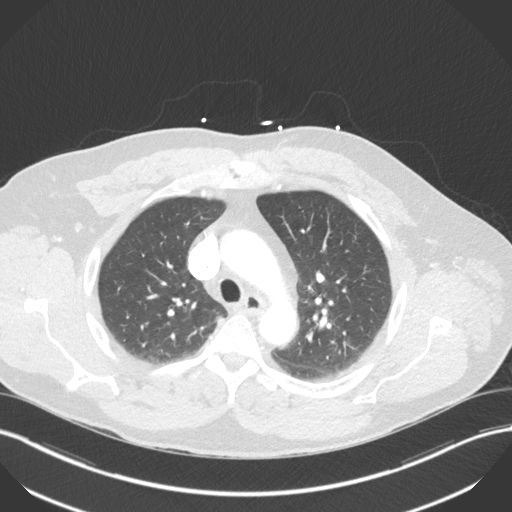
[im 137/165  soft-tissue]
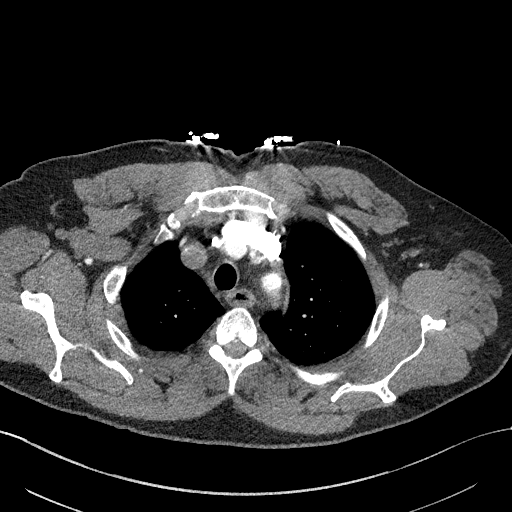
[im 151/165  lung]
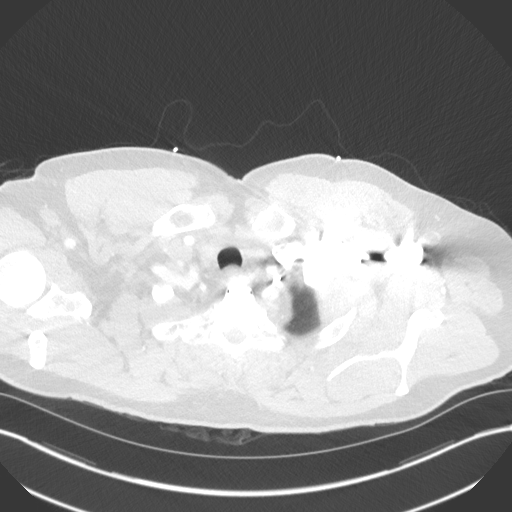

[Series 8: lung · axial · 0.78mm/px · z∈[-603,-329]mm · 8 of 165 slices shown]
[im 14/165  soft-tissue]
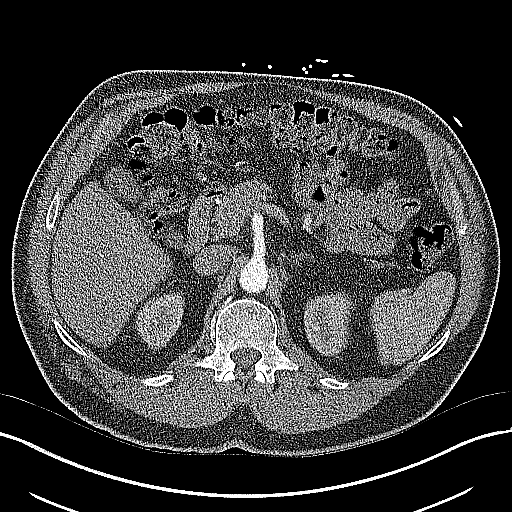
[im 42/165  soft-tissue]
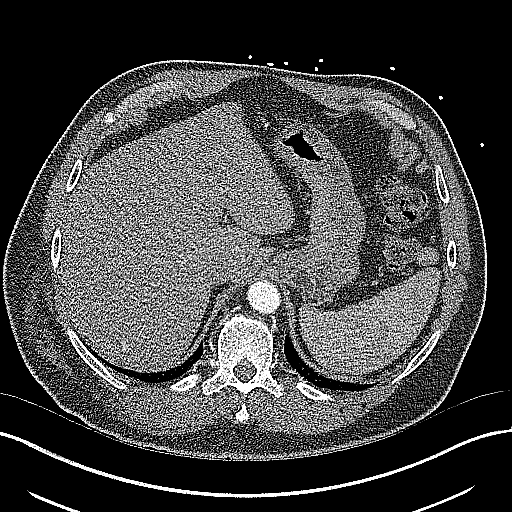
[im 55/165  soft-tissue]
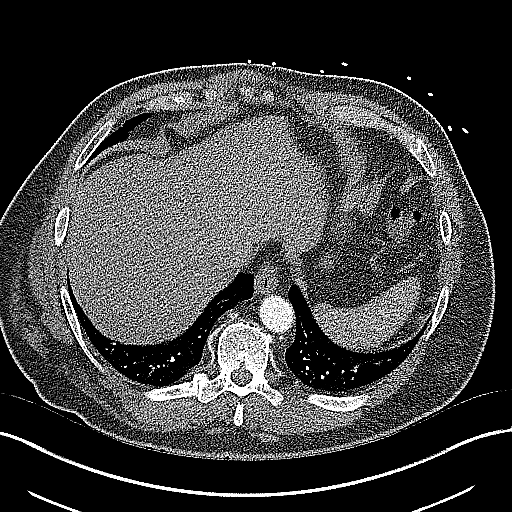
[im 69/165  soft-tissue]
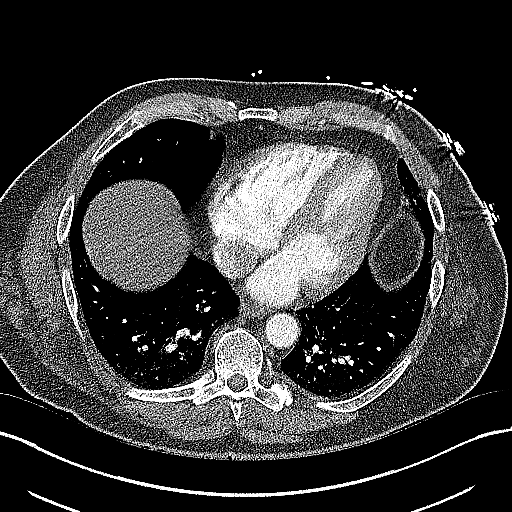
[im 96/165  soft-tissue]
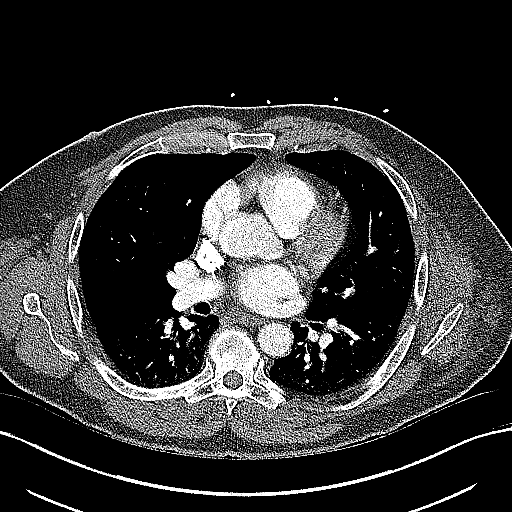
[im 110/165  soft-tissue]
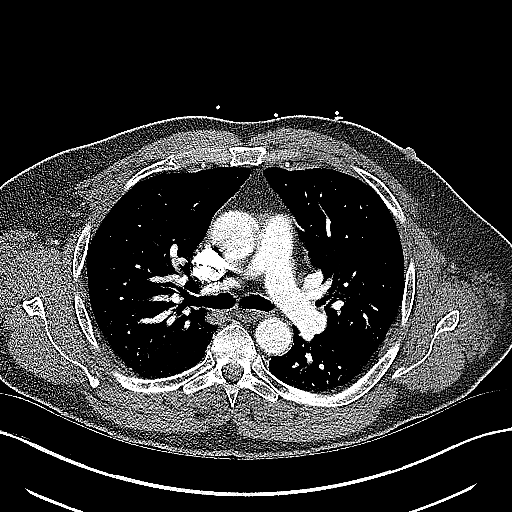
[im 124/165  soft-tissue]
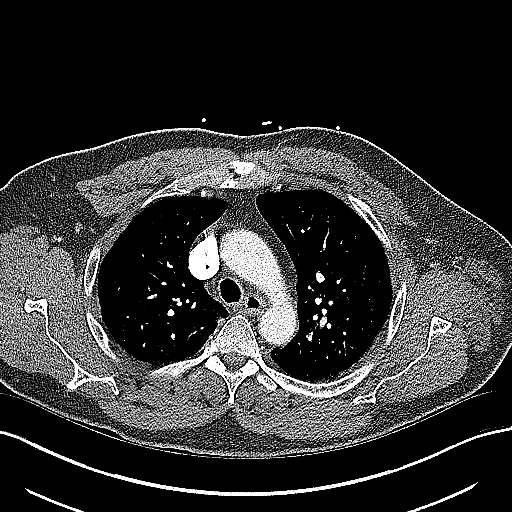
[im 151/165  soft-tissue]
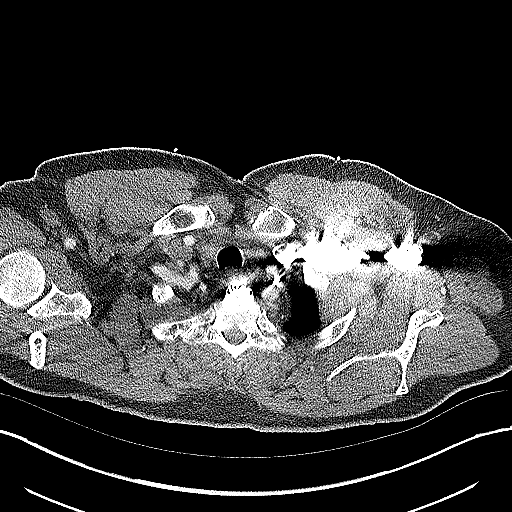

[19 of 32 positions shown; findings below may reference images not displayed]

FINDINGS: Cardiovascular: Satisfactory opacification of the pulmonary arteries
to the segmental level. No evidence of pulmonary embolism. Normal
heart size. No significant pericardial effusion. The thoracic aorta
is normal in caliber. No dissection. No atherosclerotic plaque of
the thoracic aorta. No coronary artery calcifications.

Mediastinum/Nodes: No enlarged mediastinal, hilar, or axillary lymph
nodes. Thyroid gland, trachea, and esophagus demonstrate no
significant findings.

Lungs/Pleura: Bilateral lower lobe subsegmental atelectasis. No
focal consolidation. Several scattered calcified pulmonary
micronodules. No pulmonary mass. No pleural effusion. No
pneumothorax.

Upper Abdomen: Incidentally noted replaced right hepatic artery
originating off the superior mesenteric artery. Splenule noted. No
acute abnormality.

Musculoskeletal:

No chest wall abnormality.

No suspicious lytic or blastic osseous lesions. No acute displaced
fracture.

Review of the MIP images confirms the above findings.
IMPRESSION: 1. No pulmonary embolus.
2. No aortic aneurysm or dissection.
3. No acute intrathoracic abnormality.

## 2023-03-05 ENCOUNTER — Other Ambulatory Visit (HOSPITAL_COMMUNITY): Payer: Self-pay

## 2023-03-30 DIAGNOSIS — R69 Illness, unspecified: Secondary | ICD-10-CM | POA: Diagnosis not present

## 2023-04-06 DIAGNOSIS — R69 Illness, unspecified: Secondary | ICD-10-CM | POA: Diagnosis not present

## 2023-04-15 ENCOUNTER — Other Ambulatory Visit (HOSPITAL_COMMUNITY): Payer: Self-pay

## 2023-04-17 ENCOUNTER — Other Ambulatory Visit (HOSPITAL_COMMUNITY): Payer: Self-pay

## 2023-04-18 ENCOUNTER — Other Ambulatory Visit (HOSPITAL_COMMUNITY): Payer: Self-pay

## 2023-04-18 MED ORDER — AZITHROMYCIN 250 MG PO TABS
ORAL_TABLET | ORAL | 0 refills | Status: AC
  Filled 2023-04-18: qty 6, 5d supply, fill #0

## 2023-04-19 ENCOUNTER — Other Ambulatory Visit (HOSPITAL_COMMUNITY): Payer: Self-pay

## 2023-04-30 ENCOUNTER — Other Ambulatory Visit (HOSPITAL_COMMUNITY): Payer: Self-pay

## 2023-05-02 ENCOUNTER — Ambulatory Visit (HOSPITAL_BASED_OUTPATIENT_CLINIC_OR_DEPARTMENT_OTHER): Payer: Medicare HMO | Admitting: Student

## 2023-05-02 ENCOUNTER — Encounter (HOSPITAL_BASED_OUTPATIENT_CLINIC_OR_DEPARTMENT_OTHER): Payer: Self-pay | Admitting: Student

## 2023-05-02 ENCOUNTER — Ambulatory Visit (INDEPENDENT_AMBULATORY_CARE_PROVIDER_SITE_OTHER): Payer: Medicare HMO

## 2023-05-02 DIAGNOSIS — R0781 Pleurodynia: Secondary | ICD-10-CM

## 2023-05-02 DIAGNOSIS — S20211A Contusion of right front wall of thorax, initial encounter: Secondary | ICD-10-CM

## 2023-05-02 NOTE — Progress Notes (Signed)
Chief Complaint: Right sided rib pain     History of Present Illness:    Robert Hendrix is a 67 y.o. male here today for evaluation of right-sided rib pain.  6 days ago he had an injury while mountain biking and believes that his chest and rib area hit the handlebars.  He has continued to have pain although this has improved some since the injury and he rates it at a 6/10 today.  Has been taking ibuprofen 800 mg consistently which is helping.  Does describe feeling a popping sensation and pain increases with deep inspiration.  Denies any evidence of bruising around the area or shortness of breath   Surgical History:   None  PMH/PSH/Family History/Social History/Meds/Allergies:    Past Medical History:  Diagnosis Date   Alcohol abuse 2002   Cancer [facility])    basal cell carcinoma of left chest    Chest pain 2017   Drug abuse (HCC) 2002   Hip pain    Hyperglycemia    Hyperlipidemia    on meds   Low back pain    Major depression    hx of   Nasal fracture 1980   Neck pain    OSA on CPAP    Osteoarthritis of thumb    Plantar fasciitis    Prostatitis    Psoriasis    Sleep apnea    OSA uses CPAP-   Thrombosis 2018   LEFT superficial vein thromobsis   Ulnar neuropathy at elbow of left upper extremity 05/04/2019   numbess of fingers   Past Surgical History:  Procedure Laterality Date   COLONOSCOPY  2018   SA-MAC-suprep)ade)-polyps   I & D EXTREMITY N/A 09/21/2017   Procedure: IRRIGATION AND DEBRIDEMENT EXTREMITY;  Surgeon: Eldred Manges, MD;  Location: [facility];  Service: Orthopedics;  Laterality: N/A;   NOSE SURGERY  1978   Nasal fracture    POLYPECTOMY  2018   polyps   PROSTATE BIOPSY  1990   TENDON REPAIR Left 02/23/2022   Procedure: LEFT ANTERIOR TIBIAL TENDON RECONSTRUCTION;  Surgeon: Nadara Mustard, MD;  Location: [facility] OR;  Service: Orthopedics;  Laterality: Left;   WISDOM TOOTH EXTRACTION  1990   Social History   Socioeconomic  History   Marital status: Married    Spouse name: Hope   Number of children: 3   Years of education: DMD   Highest education level: Not on file  Occupational History   Not on file  Tobacco Use   Smoking status: Light Smoker    Types: Cigars   Smokeless tobacco: Never   Tobacco comments:    1 cigar daily  Vaping Use   Vaping status: Never Used  Substance and Sexual Activity   Alcohol use: No   Drug use: Not Currently    Comment: 02/22/22- clean 20 yeasr   Sexual activity: Not on file  Other Topics Concern   Not on file  Social History Narrative   Lives with wife, Hope   Caffeine use: 6-8 cups per day   Right handed    Social Drivers of Health   Financial Resource Strain: Not on file  Food Insecurity: Not on file  Transportation Needs: Not on file  Physical Activity: Not on file  Stress: Not on file  Social Connections: Not on  file   Family History  Adopted: Yes  Problem Relation Age of Onset   Rectal cancer Neg Hx    Prostate cancer Neg Hx    Pancreatic cancer Neg Hx    Esophageal cancer Neg Hx    Stomach cancer Neg Hx    No Known Allergies Current Outpatient Medications  Medication Sig Dispense Refill   acetaminophen (TYLENOL) 500 MG tablet Take 1,000 mg by mouth every 8 (eight) hours as needed for moderate pain.     ascorbic acid (VITAMIN C) 500 MG tablet Take 500 mg by mouth daily.     aspirin EC 81 MG tablet Take 81 mg by mouth daily. Swallow whole.     benzonatate (TESSALON) 200 MG capsule Take 1 capsule (200 mg total) by mouth 3 (three) times daily as needed for cough 42 capsule 0   cefdinir (OMNICEF) 300 MG capsule Take 1 capsule (300 mg total) by mouth 2 (two) times daily for 1 week 14 capsule 0   Cholecalciferol (VITAMIN D) 125 MCG (5000 UT) CAPS Take 5,000 Units by mouth daily.     clotrimazole-betamethasone (LOTRISONE) cream Apply 1 Application topically 2 (two) times daily. 15 g 1   COVID-19 mRNA bivalent vaccine, Pfizer, (PFIZER COVID-19 VAC  BIVALENT) injection Inject into the muscle. 0.3 mL 0   cyanocobalamin (VITAMIN B12) 1000 MCG tablet Take 1,000 mcg by mouth daily.     Evolocumab (REPATHA SURECLICK) 140 MG/ML SOAJ Inject 140 mg into the skin every 21 ( twenty-one) days as directed. 6 mL 3   Evolocumab (REPATHA SURECLICK) 140 MG/ML SOAJ Inject 140 mg into the skin every 14 (fourteen) days. 6 mL 3   fluconazole (DIFLUCAN) 100 MG tablet Take 1 tablet (100 mg total) by mouth every other day as directed for 16 days 8 tablet 0   ibuprofen (ADVIL,MOTRIN) 800 MG tablet Take 800 mg by mouth 3 (three) times daily as needed for moderate pain.     loratadine (CLARITIN) 10 MG tablet Take 10 mg by mouth daily as needed for allergies.     Misc Natural Products (GLUCOSAMINE CHOND MSM FORMULA PO) Take 1 tablet by mouth daily.     Multiple Vitamins-Minerals (MULTIVITAMIN ADULTS PO) Take 1 tablet by mouth daily.     naproxen sodium (ALEVE) 220 MG tablet Take 440 mg by mouth 2 (two) times daily as needed (pain).     niacin (SLO-NIACIN) 500 MG tablet Take 500 mg by mouth daily.     oseltamivir (TAMIFLU) 75 MG capsule Take 1 capsule (75 mg total) by mouth 2 (two) times daily. 10 capsule 0   oxyCODONE-acetaminophen (PERCOCET/ROXICET) 5-325 MG tablet Take 1 tablet by mouth every 4 (four) hours as needed for severe pain. 30 tablet 0   tamsulosin (FLOMAX) 0.4 MG CAPS capsule Take 1 capsule (0.4 mg total) by mouth daily. 90 capsule 3   valACYclovir (VALTREX) 500 MG tablet Take 1 tablet (500 mg total) by mouth daily. 90 tablet 3   venlafaxine XR (EFFEXOR-XR) 150 MG 24 hr capsule Take 1 capsule (150 mg total) by mouth daily, as directed. 90 capsule 3   Zinc Gluconate 15 MG TABS Take 15 mg by mouth daily.     Current Facility-Administered Medications  Medication Dose Route Frequency Provider Last Rate Last Admin   0.9 %  sodium chloride infusion  500 mL Intravenous Continuous Armbruster, Willaim Rayas, MD       No results found.  Review of Systems:   A ROS  was performed including  pertinent positives and negatives as documented in the HPI.  Physical Exam :   Constitutional: NAD and appears stated age Neurological: Alert and oriented Psych: Appropriate affect and cooperative There were no vitals taken for this visit.   Comprehensive Musculoskeletal Exam:    Tenderness with palpation over the right anterolateral rib area.  Negative for overlying erythema or ecchymosis.  No palpable deformity or crepitus.  Symmetric chest wall motion with inspiration and expiration.  Imaging:   Xray (right ribs 3 views): No evidence of acute fracture or dislocation   I personally reviewed and interpreted the radiographs.   Assessment:   67 y.o. male with pain over the right rib area due to a recent fall off a mountain bike.  This is consistent with a rib contusion as x-rays do not reveal any evidence of fracture, although I discussed that x-rays are unable to rule out an occult fracture.  Given the improvement in his symptoms since the injury, I have recommended continuing with rest, NSAIDs, and heat/ice therapy.  He is leaving for a skiing trip in 1 month so we will have him return before then should symptoms persist or worsen for further evaluation if needed.  Plan :    - Return to clinic as needed     I personally saw and evaluated the patient, and participated in the management and treatment plan.  Hazle Nordmann, PA-C Orthopedics

## 2023-05-16 ENCOUNTER — Other Ambulatory Visit (HOSPITAL_COMMUNITY): Payer: Self-pay

## 2023-05-21 ENCOUNTER — Other Ambulatory Visit (HOSPITAL_COMMUNITY): Payer: Self-pay

## 2023-05-21 MED ORDER — FLUCONAZOLE 100 MG PO TABS
100.0000 mg | ORAL_TABLET | ORAL | 0 refills | Status: DC
  Filled 2023-05-21: qty 8, 16d supply, fill #0

## 2023-05-24 ENCOUNTER — Other Ambulatory Visit (HOSPITAL_COMMUNITY): Payer: Self-pay

## 2023-05-24 MED ORDER — COVID-19 MRNA VAC-TRIS(PFIZER) 30 MCG/0.3ML IM SUSY
0.3000 mL | PREFILLED_SYRINGE | Freq: Once | INTRAMUSCULAR | 0 refills | Status: AC
  Filled 2023-05-24: qty 0.3, 1d supply, fill #0

## 2023-06-10 ENCOUNTER — Other Ambulatory Visit (HOSPITAL_COMMUNITY): Payer: Self-pay

## 2023-06-10 ENCOUNTER — Other Ambulatory Visit: Payer: Self-pay

## 2023-06-10 DIAGNOSIS — Z1212 Encounter for screening for malignant neoplasm of rectum: Secondary | ICD-10-CM | POA: Diagnosis not present

## 2023-06-10 DIAGNOSIS — N401 Enlarged prostate with lower urinary tract symptoms: Secondary | ICD-10-CM | POA: Diagnosis not present

## 2023-06-10 DIAGNOSIS — R739 Hyperglycemia, unspecified: Secondary | ICD-10-CM | POA: Diagnosis not present

## 2023-06-10 DIAGNOSIS — E785 Hyperlipidemia, unspecified: Secondary | ICD-10-CM | POA: Diagnosis not present

## 2023-06-13 ENCOUNTER — Other Ambulatory Visit (HOSPITAL_COMMUNITY): Payer: Self-pay

## 2023-06-13 DIAGNOSIS — E785 Hyperlipidemia, unspecified: Secondary | ICD-10-CM | POA: Diagnosis not present

## 2023-06-13 DIAGNOSIS — Z0000 Encounter for general adult medical examination without abnormal findings: Secondary | ICD-10-CM | POA: Diagnosis not present

## 2023-06-13 DIAGNOSIS — R82998 Other abnormal findings in urine: Secondary | ICD-10-CM | POA: Diagnosis not present

## 2023-06-13 MED ORDER — TAMSULOSIN HCL 0.4 MG PO CAPS
0.8000 mg | ORAL_CAPSULE | Freq: Every day | ORAL | 3 refills | Status: AC
  Filled 2023-07-11 – 2023-09-05 (×2): qty 180, 90d supply, fill #0
  Filled 2023-12-03: qty 180, 90d supply, fill #1
  Filled 2024-03-10: qty 180, 90d supply, fill #2

## 2023-06-20 ENCOUNTER — Other Ambulatory Visit (HOSPITAL_COMMUNITY): Payer: Self-pay

## 2023-06-21 ENCOUNTER — Other Ambulatory Visit (HOSPITAL_COMMUNITY): Payer: Self-pay

## 2023-06-21 MED ORDER — RSVPREF3 VAC RECOMB ADJUVANTED 120 MCG/0.5ML IM SUSR
0.5000 mL | Freq: Once | INTRAMUSCULAR | 0 refills | Status: AC
  Filled 2023-06-21: qty 0.5, 1d supply, fill #0

## 2023-07-01 ENCOUNTER — Other Ambulatory Visit (HOSPITAL_COMMUNITY): Payer: Self-pay

## 2023-07-11 ENCOUNTER — Other Ambulatory Visit (HOSPITAL_COMMUNITY): Payer: Self-pay

## 2023-07-11 ENCOUNTER — Other Ambulatory Visit: Payer: Self-pay

## 2023-07-12 ENCOUNTER — Other Ambulatory Visit (HOSPITAL_COMMUNITY): Payer: Self-pay

## 2023-07-24 DIAGNOSIS — K080 Exfoliation of teeth due to systemic causes: Secondary | ICD-10-CM | POA: Diagnosis not present

## 2023-07-29 ENCOUNTER — Ambulatory Visit: Admitting: Orthopedic Surgery

## 2023-07-29 DIAGNOSIS — M25561 Pain in right knee: Secondary | ICD-10-CM

## 2023-07-30 ENCOUNTER — Encounter: Payer: Self-pay | Admitting: Orthopedic Surgery

## 2023-07-30 DIAGNOSIS — M25561 Pain in right knee: Secondary | ICD-10-CM | POA: Diagnosis not present

## 2023-07-30 MED ORDER — LIDOCAINE HCL (PF) 1 % IJ SOLN
5.0000 mL | INTRAMUSCULAR | Status: AC | PRN
  Administered 2023-07-30: 5 mL

## 2023-07-30 MED ORDER — METHYLPREDNISOLONE ACETATE 40 MG/ML IJ SUSP
40.0000 mg | INTRAMUSCULAR | Status: AC | PRN
  Administered 2023-07-30: 40 mg via INTRA_ARTICULAR

## 2023-07-30 NOTE — Progress Notes (Signed)
 Office Visit Note   Patient: Robert Hendrix           Date of Birth: 30-Apr-1956           MRN: [id number] Visit Date: 07/29/2023              Requested by: Creola Corn, MD [address] [place],  [place] [address] PCP: Creola Corn, MD  Chief Complaint  Patient presents with   Right Knee - Pain      HPI: Patient is a 68 year old gentleman who is seen for acute right knee pain.  Patient was playing tennis Saturday and developed pain after playing.  No acute injury or trauma while playing.  Assessment & Plan: Visit Diagnoses:  1. Acute pain of right knee     Plan: Patient underwent an intra-articular injection.  Follow-Up Instructions: Return if symptoms worsen or fail to improve.   Ortho Exam  Patient is alert, oriented, no adenopathy, well-dressed, normal affect, normal respiratory effort. Examination of the right knee there is no effusion.  Collaterals and cruciates are stable with no evidence of ligamentous injury.  The medial lateral joint lines are nontender to palpation no evidence of meniscal injury.  Imaging: No results found. No images are attached to the encounter.  Labs: No results found for: "HGBA1C", "ESRSEDRATE", "CRP", "LABURIC", "REPTSTATUS", "GRAMSTAIN", "CULT", "LABORGA"   Lab Results  Component Value Date   ALBUMIN 4.2 02/23/2022    No results found for: "MG" No results found for: "VD25OH"  No results found for: "PREALBUMIN"    Latest Ref Rng & Units 02/23/2022    8:54 AM 08/17/2020    4:16 PM 09/21/2017    1:22 PM  CBC EXTENDED  WBC 4.0 - 10.5 K/uL 5.8  5.8  6.6   RBC 4.22 - 5.81 MIL/uL 5.63  5.23  5.22   Hemoglobin 13.0 - 17.0 g/dL 40.1  02.7  25.3   HCT 39.0 - 52.0 % 47.5  44.4  45.0   Platelets 150 - 400 K/uL 185  179  161   NEUT# 1.7 - 7.7 K/uL  3.3  5.1   Lymph# 0.7 - 4.0 K/uL  1.8  1.0      There is no height or weight on file to calculate BMI.  Orders:  No orders of the defined types were placed in this encounter.  No  orders of the defined types were placed in this encounter.    Procedures: Large Joint Inj: R knee on 07/30/2023 7:44 AM Indications: pain and diagnostic evaluation Details: 22 G 1.5 in needle, anteromedial approach  Arthrogram: No  Medications: 5 mL lidocaine (PF) 1 %; 40 mg methylPREDNISolone acetate 40 MG/ML Outcome: tolerated well, no immediate complications Procedure, treatment alternatives, risks and benefits explained, specific risks discussed. Consent was given by the patient. Immediately prior to procedure a time out was called to verify the correct patient, procedure, equipment, support staff and site/side marked as required. Patient was prepped and draped in the usual sterile fashion.      Clinical Data: No additional findings.  ROS:  All other systems negative, except as noted in the HPI. Review of Systems  Objective: Vital Signs: There were no vitals taken for this visit.  Specialty Comments:  No specialty comments available.  PMFS History: Patient Active Problem List   Diagnosis Date Noted   Anterior tibial tendon tear, traumatic 02/23/2022   Weakness of left hand 10/12/2019   Ulnar neuropathy at elbow of left upper extremity 05/04/2019  OSA on CPAP 02/23/2019   Wound of left upper extremity 10/08/2017   Past Medical History:  Diagnosis Date   Alcohol abuse 2002   Cancer [facility])    basal cell carcinoma of left chest    Chest pain 2017   Drug abuse (HCC) 2002   Hip pain    Hyperglycemia    Hyperlipidemia    on meds   Low back pain    Major depression    hx of   Nasal fracture 1980   Neck pain    OSA on CPAP    Osteoarthritis of thumb    Plantar fasciitis    Prostatitis    Psoriasis    Sleep apnea    OSA uses CPAP-   Thrombosis 2018   LEFT superficial vein thromobsis   Ulnar neuropathy at elbow of left upper extremity 05/04/2019   numbess of fingers    Family History  Adopted: Yes  Problem Relation Age of Onset   Rectal cancer Neg Hx     Prostate cancer Neg Hx    Pancreatic cancer Neg Hx    Esophageal cancer Neg Hx    Stomach cancer Neg Hx     Past Surgical History:  Procedure Laterality Date   COLONOSCOPY  2018   SA-MAC-suprep)ade)-polyps   I & D EXTREMITY N/A 09/21/2017   Procedure: IRRIGATION AND DEBRIDEMENT EXTREMITY;  Surgeon: Eldred Manges, MD;  Location: [facility];  Service: Orthopedics;  Laterality: N/A;   NOSE SURGERY  1978   Nasal fracture    POLYPECTOMY  2018   polyps   PROSTATE BIOPSY  1990   TENDON REPAIR Left 02/23/2022   Procedure: LEFT ANTERIOR TIBIAL TENDON RECONSTRUCTION;  Surgeon: Nadara Mustard, MD;  Location: [facility] OR;  Service: Orthopedics;  Laterality: Left;   WISDOM TOOTH EXTRACTION  1990   Social History   Occupational History   Not on file  Tobacco Use   Smoking status: Light Smoker    Types: Cigars   Smokeless tobacco: Never   Tobacco comments:    1 cigar daily  Vaping Use   Vaping status: Never Used  Substance and Sexual Activity   Alcohol use: No   Drug use: Not Currently    Comment: 02/22/22- clean 20 yeasr   Sexual activity: Not on file

## 2023-09-03 ENCOUNTER — Other Ambulatory Visit (HOSPITAL_COMMUNITY): Payer: Self-pay

## 2023-09-05 ENCOUNTER — Other Ambulatory Visit (HOSPITAL_COMMUNITY): Payer: Self-pay

## 2023-09-05 MED ORDER — VALACYCLOVIR HCL 500 MG PO TABS
500.0000 mg | ORAL_TABLET | Freq: Every day | ORAL | 3 refills | Status: AC
  Filled 2023-09-05: qty 90, 90d supply, fill #0
  Filled 2023-11-25: qty 90, 90d supply, fill #1
  Filled 2024-02-23: qty 90, 90d supply, fill #2
  Filled 2024-05-17: qty 90, 90d supply, fill #3

## 2023-10-13 DIAGNOSIS — M25561 Pain in right knee: Secondary | ICD-10-CM | POA: Diagnosis not present

## 2023-10-23 ENCOUNTER — Encounter (HOSPITAL_COMMUNITY): Payer: Self-pay

## 2023-10-23 ENCOUNTER — Other Ambulatory Visit (HOSPITAL_COMMUNITY): Payer: Self-pay

## 2023-10-23 MED ORDER — FLUCONAZOLE 100 MG PO TABS
100.0000 mg | ORAL_TABLET | ORAL | 0 refills | Status: DC
  Filled 2023-10-23 – 2023-11-04 (×2): qty 8, 16d supply, fill #0

## 2023-10-30 ENCOUNTER — Encounter (HOSPITAL_COMMUNITY): Payer: Self-pay

## 2023-10-30 ENCOUNTER — Other Ambulatory Visit (HOSPITAL_COMMUNITY): Payer: Self-pay

## 2023-11-04 ENCOUNTER — Other Ambulatory Visit (HOSPITAL_COMMUNITY): Payer: Self-pay

## 2023-11-25 ENCOUNTER — Other Ambulatory Visit (HOSPITAL_COMMUNITY): Payer: Self-pay

## 2023-11-25 MED ORDER — VENLAFAXINE HCL ER 150 MG PO CP24
150.0000 mg | ORAL_CAPSULE | Freq: Every day | ORAL | 3 refills | Status: AC
  Filled 2023-11-25: qty 90, 90d supply, fill #0
  Filled 2024-02-23: qty 90, 90d supply, fill #1
  Filled 2024-05-17: qty 90, 90d supply, fill #2

## 2023-11-27 ENCOUNTER — Other Ambulatory Visit (HOSPITAL_COMMUNITY): Payer: Self-pay

## 2024-01-02 DIAGNOSIS — D485 Neoplasm of uncertain behavior of skin: Secondary | ICD-10-CM | POA: Diagnosis not present

## 2024-01-02 DIAGNOSIS — R208 Other disturbances of skin sensation: Secondary | ICD-10-CM | POA: Diagnosis not present

## 2024-01-02 DIAGNOSIS — L821 Other seborrheic keratosis: Secondary | ICD-10-CM | POA: Diagnosis not present

## 2024-01-02 DIAGNOSIS — L570 Actinic keratosis: Secondary | ICD-10-CM | POA: Diagnosis not present

## 2024-01-02 DIAGNOSIS — D225 Melanocytic nevi of trunk: Secondary | ICD-10-CM | POA: Diagnosis not present

## 2024-01-02 DIAGNOSIS — L820 Inflamed seborrheic keratosis: Secondary | ICD-10-CM | POA: Diagnosis not present

## 2024-01-02 DIAGNOSIS — L814 Other melanin hyperpigmentation: Secondary | ICD-10-CM | POA: Diagnosis not present

## 2024-01-16 ENCOUNTER — Ambulatory Visit: Payer: Self-pay | Admitting: Surgery

## 2024-01-16 DIAGNOSIS — M6208 Separation of muscle (nontraumatic), other site: Secondary | ICD-10-CM | POA: Diagnosis not present

## 2024-01-16 DIAGNOSIS — L723 Sebaceous cyst: Secondary | ICD-10-CM | POA: Diagnosis not present

## 2024-01-21 DIAGNOSIS — M47812 Spondylosis without myelopathy or radiculopathy, cervical region: Secondary | ICD-10-CM | POA: Diagnosis not present

## 2024-01-21 DIAGNOSIS — M542 Cervicalgia: Secondary | ICD-10-CM | POA: Diagnosis not present

## 2024-01-21 DIAGNOSIS — M4312 Spondylolisthesis, cervical region: Secondary | ICD-10-CM | POA: Diagnosis not present

## 2024-01-21 DIAGNOSIS — G5622 Lesion of ulnar nerve, left upper limb: Secondary | ICD-10-CM | POA: Diagnosis not present

## 2024-01-29 DIAGNOSIS — K080 Exfoliation of teeth due to systemic causes: Secondary | ICD-10-CM | POA: Diagnosis not present

## 2024-02-05 DIAGNOSIS — M5021 Other cervical disc displacement,  high cervical region: Secondary | ICD-10-CM | POA: Diagnosis not present

## 2024-02-05 DIAGNOSIS — M4312 Spondylolisthesis, cervical region: Secondary | ICD-10-CM | POA: Diagnosis not present

## 2024-02-05 DIAGNOSIS — M50221 Other cervical disc displacement at C4-C5 level: Secondary | ICD-10-CM | POA: Diagnosis not present

## 2024-02-05 DIAGNOSIS — M47812 Spondylosis without myelopathy or radiculopathy, cervical region: Secondary | ICD-10-CM | POA: Diagnosis not present

## 2024-02-05 DIAGNOSIS — M4802 Spinal stenosis, cervical region: Secondary | ICD-10-CM | POA: Diagnosis not present

## 2024-02-12 DIAGNOSIS — S129XXS Fracture of neck, unspecified, sequela: Secondary | ICD-10-CM | POA: Diagnosis not present

## 2024-02-12 DIAGNOSIS — M4312 Spondylolisthesis, cervical region: Secondary | ICD-10-CM | POA: Diagnosis not present

## 2024-02-12 DIAGNOSIS — M47812 Spondylosis without myelopathy or radiculopathy, cervical region: Secondary | ICD-10-CM | POA: Diagnosis not present

## 2024-02-12 DIAGNOSIS — Z6829 Body mass index (BMI) 29.0-29.9, adult: Secondary | ICD-10-CM | POA: Diagnosis not present

## 2024-02-14 ENCOUNTER — Other Ambulatory Visit (HOSPITAL_BASED_OUTPATIENT_CLINIC_OR_DEPARTMENT_OTHER): Payer: Self-pay

## 2024-02-14 MED ORDER — PAXLOVID (300/100) 20 X 150 MG & 10 X 100MG PO TBPK
3.0000 | ORAL_TABLET | Freq: Two times a day (BID) | ORAL | 0 refills | Status: DC
  Filled 2024-02-14: qty 30, 5d supply, fill #0

## 2024-03-02 ENCOUNTER — Other Ambulatory Visit (HOSPITAL_COMMUNITY): Payer: Self-pay

## 2024-03-02 MED ORDER — FLUZONE HIGH-DOSE 0.5 ML IM SUSY
0.5000 mL | PREFILLED_SYRINGE | Freq: Once | INTRAMUSCULAR | 0 refills | Status: AC
  Filled 2024-03-02: qty 0.5, 1d supply, fill #0

## 2024-03-04 ENCOUNTER — Ambulatory Visit: Admitting: Rehabilitative and Restorative Service Providers"

## 2024-03-05 ENCOUNTER — Ambulatory Visit: Admitting: Rehabilitative and Restorative Service Providers"

## 2024-03-09 ENCOUNTER — Other Ambulatory Visit (HOSPITAL_BASED_OUTPATIENT_CLINIC_OR_DEPARTMENT_OTHER): Payer: Self-pay

## 2024-03-10 ENCOUNTER — Ambulatory Visit: Admitting: Rehabilitative and Restorative Service Providers"

## 2024-03-10 ENCOUNTER — Encounter: Payer: Self-pay | Admitting: Rehabilitative and Restorative Service Providers"

## 2024-03-10 DIAGNOSIS — M542 Cervicalgia: Secondary | ICD-10-CM | POA: Diagnosis not present

## 2024-03-10 DIAGNOSIS — R293 Abnormal posture: Secondary | ICD-10-CM | POA: Diagnosis not present

## 2024-03-10 NOTE — Therapy (Signed)
 OUTPATIENT PHYSICAL THERAPY CERVICAL EVALUATION   Patient Name: Robert Hendrix MRN: [id number] DOB:[birth year], 68 y.o., male Today's Date: 03/10/2024  END OF SESSION:  PT End of Session - 03/10/24 1603     Visit Number 1    Number of Visits 20    Date for Recertification  05/19/24    Authorization Type [employer] $10 copay, no auth needed.    PT Start Time 1604    PT Stop Time 1632    PT Time Calculation (min) 28 min    Activity Tolerance Patient tolerated treatment well    Behavior During Therapy WFL for tasks assessed/performed          Past Medical History:  Diagnosis Date   Alcohol  abuse 2002   Cancer [facility])    basal cell carcinoma of left chest    Chest pain 2017   Drug abuse (HCC) 2002   Hip pain    Hyperglycemia    Hyperlipidemia    on meds   Low back pain    Major depression    hx of   Nasal fracture 1980   Neck pain    OSA on CPAP    Osteoarthritis of thumb    Plantar fasciitis    Prostatitis    Psoriasis    Sleep apnea    OSA uses CPAP-   Thrombosis 2018   LEFT superficial vein thromobsis   Ulnar neuropathy at elbow of left upper extremity 05/04/2019   numbess of fingers   Past Surgical History:  Procedure Laterality Date   COLONOSCOPY  2018   SA-MAC-suprep)ade)-polyps   I & D EXTREMITY N/A 09/21/2017   Procedure: IRRIGATION AND DEBRIDEMENT EXTREMITY;  Surgeon: Barbarann Oneil BROCKS, MD;  Location: [facility];  Service: Orthopedics;  Laterality: N/A;   NOSE SURGERY  1978   Nasal fracture    POLYPECTOMY  2018   polyps   PROSTATE BIOPSY  1990   TENDON REPAIR Left 02/23/2022   Procedure: LEFT ANTERIOR TIBIAL TENDON RECONSTRUCTION;  Surgeon: Harden Jerona GAILS, MD;  Location: [facility] OR;  Service: Orthopedics;  Laterality: Left;   WISDOM TOOTH EXTRACTION  1990   Patient Active Problem List   Diagnosis Date Noted   Anterior tibial tendon tear, traumatic 02/23/2022   Weakness of left hand 10/12/2019   Ulnar neuropathy at elbow of left upper extremity 05/04/2019   OSA on  CPAP 02/23/2019   Wound of left upper extremity 10/08/2017    PCP: Onita Rush MD  REFERRING PROVIDER: Mavis Purchase, MD  REFERRING DIAG: M54.2 Cervicalgia  THERAPY DIAG:  Cervicalgia  Abnormal posture  Rationale for Evaluation and Treatment: Rehabilitation  ONSET DATE: Several months.   SUBJECTIVE:  SUBJECTIVE STATEMENT: Pt indicated onset of symptoms insidious onset a couple months ago.  Reported complaints in both sides of neck into shoulders.  Denied headaches, vision changes.  No specific worsening in last few months of previous.   Pt indicated Rt hand dominant.  Surgery for work performed with looking to Lt.     PERTINENT HISTORY:  Fusion for numbness 2020  PAIN:  NPRS scale: at worst 5/10, at best 0/10 Pain location: bilateral cervical mid to lower  Pain description: achy, sharp at times.  Aggravating factors: head movement, sleeping position.  Relieving factors: motrin, topical surgical   PRECAUTIONS: None  RED FLAGS: None  WEIGHT BEARING RESTRICTIONS: No  FALLS:  Has patient fallen in last 6 months? No  LIVING ENVIRONMENT: Lives in: House/apartment  OCCUPATION: Transport Planner, has been working.   PLOF: Independent, tennis, biking, snowski trip.  Doesn't do yard work.   PATIENT GOALS: Reduce pain,    OBJECTIVE:    PATIENT SURVEYS: Patient-Specific Activity Scoring Scheme  0 represents "unable to perform." 10 represents "able to perform at prior level. 0 1 2 3 4 5 6 7 8 9  10 (Date and Score)   Activity Eval  03/10/2024    1. Head turns Lt and Rt  8     2. Head turns Lt and Rt  9    3. Sleeping  9   4.    5.    Score 8.667    Total score = sum of the activity scores/number of activities Minimum detectable change (90%CI) for average  score = 2 points Minimum detectable change (90%CI) for single activity score = 3 points    COGNITION: 03/10/2024 Overall cognitive status: Within functional limits for tasks assessed  SENSATION: 03/10/2024 [facility]  POSTURE:  03/10/2024 Mild rounded shoulders , FHP  PALPATION: 03/10/2024 Trigger points and tightness bilateral upper traps, more middle and central vs. Lateral.    CERVICAL ROM:   ROM AROM (deg) Eval 03/10/2024  Flexion 51 Rt pulling more than L  Extension 41  Right lateral flexion   Left lateral flexion   Right rotation 60 c bilateral neck symptoms  Left rotation 60 c bilateral neck symptoms    (Blank rows = not tested)  UPPER EXTREMITY ROM:   ROM Right Eval 03/10/2024 Left Eval 03/10/2024  Shoulder flexion    Shoulder extension    Shoulder abduction    Shoulder adduction    Shoulder extension    Shoulder internal rotation    Shoulder external rotation    Elbow flexion    Elbow extension    Wrist flexion    Wrist extension    Wrist ulnar deviation    Wrist radial deviation    Wrist pronation    Wrist supination     (Blank rows = not tested)  UPPER EXTREMITY MMT:  MMT Right Eval 03/10/2024 Left Eval 03/10/2024  Shoulder flexion 5/5 5/5  Shoulder extension    Shoulder abduction 5/5 5/5  Shoulder adduction    Shoulder extension    Shoulder internal rotation 5/5 5/5  Shoulder external rotation 5/5 5/5  Middle trapezius    Lower trapezius    Elbow flexion    Elbow extension    Wrist flexion    Wrist extension    Wrist ulnar deviation    Wrist radial deviation    Wrist pronation    Wrist supination    Grip strength     (Blank rows = not tested)  SPECIAL TESTS:  03/10/2024  Not tested  FUNCTIONAL TESTS:  03/10/2024 Not tested                                                                                                                                                                                  TODAY'S TREATMENT:                                                                                                        DATE:  03/10/2024 Therex:    HEP instruction/performance c cues for techniques, handout provided.  Trial set performed of each for comprehension and symptom assessment.  See below for exercise list  Manual Percussive device bilateral upper trap.  Cues for home use options during use in clinic.  Discussed techniques.   PATIENT EDUCATION:  03/10/2024 Education details: HEP, POC Person educated: Patient Education method: Programmer, Multimedia, Demonstration, Verbal cues, and Handouts Education comprehension: verbalized understanding, returned demonstration, and verbal cues required  HOME EXERCISE PROGRAM: Access Code: [email or web address] URL: [email or web address] Date: 03/10/2024 Prepared by: Ozell Silvan  Exercises - Seated Scapular Retraction  - 2-3 x daily - 7 x weekly - 1 sets - 10 reps - 5 hold - Supine Scapular Retraction  - 2-3 x daily - 7 x weekly - 1 sets - 10 reps - 5 hold - Supine Cervical Retraction with Towel  - 2-3 x daily - 7 x weekly - 1 sets - 10 reps - 5 hold - Cervical Retraction at Wall  - 2-3 x daily - 7 x weekly - 1 sets - 10 reps - 5 hold - Seated Upper Trapezius Stretch  - 2-3 x daily - 7 x weekly - 1 sets - 5 reps - 15 hold  ASSESSMENT:  CLINICAL IMPRESSION: Patient is a 68 y.o. who comes to clinic with complaints of cervical with mobility, strength and movement coordination deficits that impair their ability to perform usual daily and recreational functional activities without increase difficulty/symptoms at this time.  Patient to benefit from skilled PT services to address impairments and limitations to improve to previous level of function without restriction secondary to condition.    OBJECTIVE IMPAIRMENTS: decreased activity tolerance, decreased coordination, decreased endurance, decreased mobility, decreased ROM, decreased strength, increased fascial  restrictions, impaired perceived functional ability, increased  muscle spasms, impaired flexibility, improper body mechanics, postural dysfunction, and pain.   ACTIVITY LIMITATIONS: bending and sleeping  PARTICIPATION LIMITATIONS: cleaning, interpersonal relationship, community activity, and occupation  PERSONAL FACTORS: history of fusion  REHAB POTENTIAL: Good  CLINICAL DECISION MAKING: Stable/uncomplicated  EVALUATION COMPLEXITY: Low   GOALS: Goals reviewed with patient? Yes  SHORT TERM GOALS: (target date for Short term goals are 3 weeks 03/31/2024)  1.Patient will demonstrate independent use of home exercise program to maintain progress from in clinic treatments. Goal status: New  LONG TERM GOALS: (target dates for all long term goals are 10 weeks  05/19/2024 )   1. Patient will demonstrate/report pain at worst less than or equal to 2/10 to facilitate minimal limitation in daily activity secondary to pain symptoms. Goal status: New   2. Patient will demonstrate independent use of home exercise program to facilitate ability to maintain/progress functional gains from skilled physical therapy services. Goal status: New   3. Patient will demonstrate Patient specific functional scale avg > or = 8/10 to indicate reduced disability due to condition.  Goal status: New   4.  Patient will demonstrate cervical AROM WFL s symptoms to facilitate usual head movements for daily activity including driving, self care.   Goal status: New     PLAN:  PT FREQUENCY: 1-2x/week  PT DURATION: 10 weeks  Can include 02853- PT Re-evaluation, 97110-Therapeutic exercises, 97530- Therapeutic activity, [id number]- Neuromuscular re-education, 97535- Self Care, 97140- Manual therapy, [phone]- Gait training, [phone]- Orthotic Fit/training, [phone]- Canalith repositioning, [id number]- Aquatic Therapy, [phone]- Electrical stimulation (unattended), [id number] Physical performance testing, 97016- Vasopneumatic device, [id number]-  Ultrasound, [id number]- Traction (mechanical), [id number]- Ionotophoresis 4mg /ml Dexamethasone ,  [id number] - Needle insertion w/o injection 1 or 2 muscles, [id number] - Needle insertion w/o injection 3 or more muscles.   Patient/Family education, Balance training, Stair training, Taping, Dry Needling, Joint mobilization, Joint manipulation, Spinal manipulation, Spinal mobilization, Scar mobilization, Vestibular training, Visual/preceptual remediation/compensation, DME instructions, Cryotherapy, and Moist heat.  All performed as medically necessary.  All included unless contraindicated  PLAN FOR NEXT SESSION: Check HEP use/response.  Myofascial release options.  Recheck range.  Progressive posterior chain strengthening for neck and upper back.    Ozell Silvan, PT, DPT, OCS, ATC 03/10/24  4:01 PM

## 2024-03-16 ENCOUNTER — Encounter: Payer: Self-pay | Admitting: Radiology

## 2024-03-24 ENCOUNTER — Ambulatory Visit: Admitting: Rehabilitative and Restorative Service Providers"

## 2024-03-24 ENCOUNTER — Other Ambulatory Visit: Payer: Self-pay

## 2024-03-24 ENCOUNTER — Encounter: Payer: Self-pay | Admitting: Rehabilitative and Restorative Service Providers"

## 2024-03-24 ENCOUNTER — Encounter (HOSPITAL_BASED_OUTPATIENT_CLINIC_OR_DEPARTMENT_OTHER): Payer: Self-pay | Admitting: Surgery

## 2024-03-24 DIAGNOSIS — M542 Cervicalgia: Secondary | ICD-10-CM

## 2024-03-24 DIAGNOSIS — R293 Abnormal posture: Secondary | ICD-10-CM

## 2024-03-24 NOTE — Therapy (Signed)
 OUTPATIENT PHYSICAL THERAPY TREATMENT   Patient Name: Robert Hendrix MRN: [id number] DOB:[birth year], 68 y.o., male Today's Date: 03/24/2024  END OF SESSION:  PT End of Session - 03/24/24 1550     Visit Number 2    Number of Visits 20    Date for Recertification  05/19/24    Authorization Type [employer] $10 copay, no auth needed.    PT Start Time 1557    PT Stop Time 1636    PT Time Calculation (min) 39 min    Activity Tolerance Patient tolerated treatment well    Behavior During Therapy WFL for tasks assessed/performed           Past Medical History:  Diagnosis Date   Alcohol  abuse 2002   [facility])    basal cell carcinoma of left chest    Chest pain 2017   Drug abuse (HCC) 2002   Hip pain    Hyperglycemia    Hyperlipidemia    on meds   Low back pain    Major depression    hx of   Nasal fracture 1980   Neck pain    OSA on CPAP    Osteoarthritis of thumb    Plantar fasciitis    Prostatitis    Psoriasis    Sleep apnea    OSA uses CPAP-   Thrombosis 2018   LEFT superficial vein thromobsis   Ulnar neuropathy at elbow of left upper extremity 05/04/2019   numbess of fingers   Past Surgical History:  Procedure Laterality Date   COLONOSCOPY  2018   SA-MAC-suprep)ade)-polyps   I & D EXTREMITY N/A 09/21/2017   Procedure: IRRIGATION AND DEBRIDEMENT EXTREMITY;  Surgeon: Barbarann Oneil BROCKS, MD;  Location: [facility];  Service: Orthopedics;  Laterality: N/A;   NOSE SURGERY  1978   Nasal fracture    POLYPECTOMY  2018   polyps   PROSTATE BIOPSY  1990   TENDON REPAIR Left 02/23/2022   Procedure: LEFT ANTERIOR TIBIAL TENDON RECONSTRUCTION;  Surgeon: Harden Jerona GAILS, MD;  Location: [facility] OR;  Service: Orthopedics;  Laterality: Left;   WISDOM TOOTH EXTRACTION  1990   Patient Active Problem List   Diagnosis Date Noted   Anterior tibial tendon tear, traumatic 02/23/2022   Weakness of left hand 10/12/2019   Ulnar neuropathy at elbow of left upper extremity 05/04/2019   OSA on CPAP  02/23/2019   Wound of left upper extremity 10/08/2017    PCP: Onita Rush MD  REFERRING PROVIDER: Mavis Purchase, MD  REFERRING DIAG: M54.2 Cervicalgia  THERAPY DIAG:  Cervicalgia  Abnormal posture  Rationale for Evaluation and Treatment: Rehabilitation  ONSET DATE: Several months.   SUBJECTIVE:  SUBJECTIVE STATEMENT: Pt indicated doing some HEP use, not everyday.  Pt indicated pain similar.    Pt indicated Rt hand dominant.  Surgery for work performed with looking to Lt.     PERTINENT HISTORY:  Fusion for numbness 2020  PAIN:  NPRS scale: upon arrival at rest 0/10.  At worst today:  5/10.  Pain location: bilateral cervical mid to lower  Pain description: achy, sharp at times.  Aggravating factors: head movement, sleeping position.  Relieving factors: motrin, topical surgical   PRECAUTIONS: None  RED FLAGS: None  WEIGHT BEARING RESTRICTIONS: No  FALLS:  Has patient fallen in last 6 months? No  LIVING ENVIRONMENT: Lives in: House/apartment  OCCUPATION: Transport Planner, has been working.   PLOF: Independent, tennis, biking, snowski trip.  Doesn't do yard work.   PATIENT GOALS: Reduce pain,    OBJECTIVE:    PATIENT SURVEYS: Patient-Specific Activity Scoring Scheme  0 represents "unable to perform." 10 represents "able to perform at prior level. 0 1 2 3 4 5 6 7 8 9  10 (Date and Score)   Activity Eval  03/10/2024    1. Head turns Lt and Rt  8     2. Head turns Lt and Rt  9    3. Sleeping  9   4.    5.    Score 8.667    Total score = sum of the activity scores/number of activities Minimum detectable change (90%CI) for average score = 2 points Minimum detectable change (90%CI) for single activity score = 3  points    COGNITION: 03/10/2024 Overall cognitive status: Within functional limits for tasks assessed  SENSATION: 03/10/2024 [facility]  POSTURE:  03/10/2024 Mild rounded shoulders , FHP  PALPATION: 03/10/2024 Trigger points and tightness bilateral upper traps, more middle and central vs. Lateral.    CERVICAL ROM:   ROM AROM (deg) Eval 03/10/2024 AROM 03/24/2024  Flexion 51 Rt pulling more than L 60  Extension 41 50  Right lateral flexion    Left lateral flexion    Right rotation 60 c bilateral neck symptoms 70  Left rotation 60 c bilateral neck symptoms  62   (Blank rows = not tested)  UPPER EXTREMITY ROM:   ROM Right Eval 03/10/2024 Left Eval 03/10/2024  Shoulder flexion    Shoulder extension    Shoulder abduction    Shoulder adduction    Shoulder extension    Shoulder internal rotation    Shoulder external rotation    Elbow flexion    Elbow extension    Wrist flexion    Wrist extension    Wrist ulnar deviation    Wrist radial deviation    Wrist pronation    Wrist supination     (Blank rows = not tested)  UPPER EXTREMITY MMT:  MMT Right Eval 03/10/2024 Left Eval 03/10/2024  Shoulder flexion 5/5 5/5  Shoulder extension    Shoulder abduction 5/5 5/5  Shoulder adduction    Shoulder extension    Shoulder internal rotation 5/5 5/5  Shoulder external rotation 5/5 5/5  Middle trapezius    Lower trapezius    Elbow flexion    Elbow extension    Wrist flexion    Wrist extension    Wrist ulnar deviation    Wrist radial deviation    Wrist pronation    Wrist supination    Grip strength     (Blank rows = not tested)  SPECIAL TESTS:  03/10/2024 Not tested  FUNCTIONAL TESTS:  03/10/2024 Not  tested                                                                                                                                                                                 TODAY'S TREATMENT:                                                                                                        DATE:  03/24/2024 Therex: UBE fwd/back 4 mins lvl 3.5 with 1 min rest between. Detailed active compression options to trigger points in SCM.   Manual Percussive device to Lt and Rt upper trap, levator scap.   Prone cPA mid and upper thoracic g4 mobs  Self Care Education verbally on post manual and needling soreness possibility and effective strategy to help address and improve following visit.  Strategies included but not limited to:  heat/ice prn, increased water  intake, use of HEP and general mobility to move muscle soreness out.  Pt voiced understanding.    Trigger Point Dry Needling  Initial Treatment: Pt instructed on Dry Needling rational, procedures, and possible side effects. Pt instructed to expect mild to moderate muscle soreness later in the day and/or into the next day.  Pt instructed in methods to reduce muscle soreness. Pt instructed to continue prescribed HEP. Because Dry Needling was performed over or adjacent to a lung field, pt was educated on S/S of pneumothorax and to seek immediate medical attention should they occur.  Patient was educated on signs and symptoms of infection and other risk factors and advised to seek medical attention should they occur.  Patient verbalized understanding of these instructions and education.   Patient Verbal Consent Given: Yes Education Handout Provided: Yes Muscles Treated: Lt, Rt upper trap Treatment Response/Outcome: local twitch response   TODAY'S TREATMENT:  DATE:  03/10/2024 Therex:    HEP instruction/performance c cues for techniques, handout provided.  Trial set performed of each for comprehension and symptom assessment.  See below for exercise list  Manual Percussive device bilateral upper trap.  Cues for home use options during use in clinic.  Discussed techniques.   PATIENT  EDUCATION:  03/10/2024 Education details: HEP, POC Person educated: Patient Education method: Programmer, Multimedia, Demonstration, Verbal cues, and Handouts Education comprehension: verbalized understanding, returned demonstration, and verbal cues required  HOME EXERCISE PROGRAM: Access Code: [email or web address] URL: [email or web address] Date: 03/10/2024 Prepared by: Ozell Silvan  Exercises - Seated Scapular Retraction  - 2-3 x daily - 7 x weekly - 1 sets - 10 reps - 5 hold - Supine Scapular Retraction  - 2-3 x daily - 7 x weekly - 1 sets - 10 reps - 5 hold - Supine Cervical Retraction with Towel  - 2-3 x daily - 7 x weekly - 1 sets - 10 reps - 5 hold - Cervical Retraction at Wall  - 2-3 x daily - 7 x weekly - 1 sets - 10 reps - 5 hold - Seated Upper Trapezius Stretch  - 2-3 x daily - 7 x weekly - 1 sets - 5 reps - 15 hold  ASSESSMENT:  CLINICAL IMPRESSION: Cervical range with some improvements noted.  Similar pain complaints reported and Pt requested dry needling today.  Strong twitch response noted in bilateral upper trap.  Detailed self care aftercare approach to manage symptom response to contractions.  Will reassess next visit fro effectiveness.    OBJECTIVE IMPAIRMENTS: decreased activity tolerance, decreased coordination, decreased endurance, decreased mobility, decreased ROM, decreased strength, increased fascial restrictions, impaired perceived functional ability, increased muscle spasms, impaired flexibility, improper body mechanics, postural dysfunction, and pain.   ACTIVITY LIMITATIONS: bending and sleeping  PARTICIPATION LIMITATIONS: cleaning, interpersonal relationship, community activity, and occupation  PERSONAL FACTORS: history of fusion  REHAB POTENTIAL: Good  CLINICAL DECISION MAKING: Stable/uncomplicated  EVALUATION COMPLEXITY: Low   GOALS: Goals reviewed with patient? Yes  SHORT TERM GOALS: (target date for Short term goals are 3 weeks  03/31/2024)  1.Patient will demonstrate independent use of home exercise program to maintain progress from in clinic treatments. Goal status: on going 03/24/2024  LONG TERM GOALS: (target dates for all long term goals are 10 weeks  05/19/2024 )   1. Patient will demonstrate/report pain at worst less than or equal to 2/10 to facilitate minimal limitation in daily activity secondary to pain symptoms. Goal status: New   2. Patient will demonstrate independent use of home exercise program to facilitate ability to maintain/progress functional gains from skilled physical therapy services. Goal status: New   3. Patient will demonstrate Patient specific functional scale avg > or = 8/10 to indicate reduced disability due to condition.  Goal status: New   4.  Patient will demonstrate cervical AROM WFL s symptoms to facilitate usual head movements for daily activity including driving, self care.   Goal status: New     PLAN:  PT FREQUENCY: 1-2x/week  PT DURATION: 10 weeks  Can include 02853- PT Re-evaluation, 97110-Therapeutic exercises, 97530- Therapeutic activity, [id number]- Neuromuscular re-education, 97535- Self Care, 97140- Manual therapy, [phone]- Gait training, [phone]- Orthotic Fit/training, [phone]- Canalith repositioning, [id number]- Aquatic Therapy, [phone]- Electrical stimulation (unattended), [id number] Physical performance testing, 97016- Vasopneumatic device, [id number]- Ultrasound, [id number]- Traction (mechanical), [id number]- Ionotophoresis 4mg /ml Dexamethasone ,  79439 - Needle insertion w/o injection 1 or 2 muscles, 20561 - Needle insertion w/o injection 3 or more muscles.  Patient/Family education, Balance training, Stair training, Taping, Dry Needling, Joint mobilization, Joint manipulation, Spinal manipulation, Spinal mobilization, Scar mobilization, Vestibular training, Visual/preceptual remediation/compensation, DME instructions, Cryotherapy, and Moist heat.  All performed as medically necessary.  All included  unless contraindicated  PLAN FOR NEXT SESSION: DN follow up as needed. Thoracic mobility gains.    Ozell Silvan, PT, DPT, OCS, ATC 03/10/24  4:01 PM

## 2024-03-24 NOTE — Patient Instructions (Signed)

## 2024-03-31 ENCOUNTER — Encounter (HOSPITAL_BASED_OUTPATIENT_CLINIC_OR_DEPARTMENT_OTHER)
Admission: RE | Disposition: A | Discharge status: Home / Self Care | Payer: Self-pay | Source: Home / Self Care | Attending: Surgery

## 2024-03-31 ENCOUNTER — Ambulatory Visit (HOSPITAL_BASED_OUTPATIENT_CLINIC_OR_DEPARTMENT_OTHER)
Admission: RE | Admit: 2024-03-31 | Discharge: 2024-03-31 | Disposition: A | Discharge status: Home / Self Care | Attending: Surgery | Admitting: Surgery

## 2024-03-31 ENCOUNTER — Encounter (HOSPITAL_BASED_OUTPATIENT_CLINIC_OR_DEPARTMENT_OTHER): Payer: Self-pay | Admitting: Anesthesiology

## 2024-03-31 ENCOUNTER — Encounter (HOSPITAL_BASED_OUTPATIENT_CLINIC_OR_DEPARTMENT_OTHER): Payer: Self-pay | Admitting: Surgery

## 2024-03-31 DIAGNOSIS — L723 Sebaceous cyst: Secondary | ICD-10-CM | POA: Insufficient documentation

## 2024-03-31 HISTORY — PX: EXCISION OF BACK LESION: SHX6597

## 2024-03-31 SURGERY — EXCISION, LESION, BACK
Anesthesia: LOCAL | Site: Back

## 2024-03-31 MED ORDER — CHLORHEXIDINE GLUCONATE CLOTH 2 % EX PADS: 6.0000 | MEDICATED_PAD | Freq: Once | CUTANEOUS | Status: DC

## 2024-03-31 MED ORDER — BUPIVACAINE-EPINEPHRINE 0.5% -1:200000 IJ SOLN
INTRAMUSCULAR | Status: DC | PRN
  Administered 2024-03-31: 20 mL

## 2024-03-31 SURGICAL SUPPLY — 36 items
BLADE SURG 15 STRL LF DISP TIS (BLADE) ×1 IMPLANT
CANISTER SUCT 1200ML W/VALVE (MISCELLANEOUS) IMPLANT
CHLORAPREP W/TINT 26 (MISCELLANEOUS) ×1 IMPLANT
CLEANER CAUTERY TIP PAD (MISCELLANEOUS) IMPLANT
COVER BACK TABLE 60X90IN (DRAPES) ×1 IMPLANT
COVER MAYO STAND STRL (DRAPES) ×1 IMPLANT
DERMABOND ADVANCED .7 DNX12 (GAUZE/BANDAGES/DRESSINGS) ×1 IMPLANT
DRAPE LAPAROTOMY 100X72 PEDS (DRAPES) IMPLANT
DRAPE U-SHAPE 76X120 STRL (DRAPES) IMPLANT
DRAPE UTILITY XL STRL (DRAPES) ×1 IMPLANT
DRSG TEGADERM 4X4.75 (GAUZE/BANDAGES/DRESSINGS) IMPLANT
ELECTRODE REM PT RTRN 9FT ADLT (ELECTROSURGICAL) ×1 IMPLANT
GAUZE SPONGE 4X4 12PLY STRL LF (GAUZE/BANDAGES/DRESSINGS) ×1 IMPLANT
GLOVE BIOGEL PI IND STRL 6.5 (GLOVE) IMPLANT
GLOVE SURG ORTHO 8.0 STRL STRW (GLOVE) ×1 IMPLANT
GOWN STRL REUS W/ TWL LRG LVL3 (GOWN DISPOSABLE) ×1 IMPLANT
GOWN STRL REUS W/ TWL XL LVL3 (GOWN DISPOSABLE) ×1 IMPLANT
NDL HYPO 25X1 1.5 SAFETY (NEEDLE) ×1 IMPLANT
NEEDLE HYPO 25X1 1.5 SAFETY (NEEDLE) ×1 IMPLANT
PACK BASIN DAY SURGERY FS (CUSTOM PROCEDURE TRAY) ×1 IMPLANT
PENCIL SMOKE EVACUATOR (MISCELLANEOUS) ×1 IMPLANT
SHEET MEDIUM DRAPE 40X70 STRL (DRAPES) IMPLANT
SLEEVE SCD COMPRESS KNEE MED (STOCKING) IMPLANT
SPIKE FLUID TRANSFER (MISCELLANEOUS) IMPLANT
STRIP CLOSURE SKIN 1/2X4 (GAUZE/BANDAGES/DRESSINGS) IMPLANT
SUCTION TUBE FRAZIER 10FR DISP (SUCTIONS) IMPLANT
SUT ETHILON 3 0 PS 1 (SUTURE) IMPLANT
SUT ETHILON 4 0 PS 2 18 (SUTURE) IMPLANT
SUT MNCRL AB 3-0 PS2 18 (SUTURE) IMPLANT
SUT MNCRL AB 4-0 PS2 18 (SUTURE) IMPLANT
SUT VIC AB 4-0 PS2 18 (SUTURE) IMPLANT
SUT VICRYL 3-0 CR8 SH (SUTURE) ×1 IMPLANT
SYR CONTROL 10ML LL (SYRINGE) ×1 IMPLANT
TOWEL GREEN STERILE FF (TOWEL DISPOSABLE) ×2 IMPLANT
TUBE CONNECTING 20X1/4 (TUBING) IMPLANT
YANKAUER SUCT BULB TIP NO VENT (SUCTIONS) IMPLANT

## 2024-03-31 NOTE — H&P (Signed)
 REFERRING PHYSICIAN: Lenon Applethwaite, PA  PROVIDER: Jayleon Mcfarlane Robert SPINNER, MD   Chief Complaint: New Consultation (Sebaceous cysts on back)  History of Present Illness:  Patient is referred for surgical evaluation of cutaneous lesions on the back. Patient has a history of an abscessed sebaceous cyst which underwent incision and drainage in 2022. Patient has now developed a raised firm mass at the site of the previous incision and drainage. He presents today to discuss surgical excision.  Patient also notes a rectus diastasis.  Review of Systems: A complete review of systems was obtained from the patient. I have reviewed this information and discussed as appropriate with the patient. See HPI as well for other ROS.  Review of Systems  Constitutional: Negative.  HENT: Negative.  Eyes: Negative.  Respiratory: Negative.  Cardiovascular: Negative.  Gastrointestinal: Negative.  Genitourinary: Negative.  Musculoskeletal:  Rectus diastasis  Skin:  Lesions on back  Neurological: Negative.  Endo/Heme/Allergies: Negative.  Psychiatric/Behavioral: Negative.    Medical History: Past Medical History:  Diagnosis Date  Sleep apnea  Substance abuse (CMS/HHS-HCC)   Patient Active Problem List  Diagnosis  OSA on CPAP  Ulnar neuropathy at elbow of left upper extremity  Weakness of left hand  Wound of left upper extremity  Sebaceous cyst  Rectus diastasis   Past Surgical History:  Procedure Laterality Date  acdf surgery N/A    No Known Allergies  Current Outpatient Medications on File Prior to Visit  Medication Sig Dispense Refill  aspirin 81 MG EC tablet Take by mouth once daily  cholecalciferol (VITAMIN D3) 1000 unit tablet Take by mouth  doxycycline  (VIBRA -TABS) 100 MG tablet (Patient not taking: Reported on 12/27/2020)  gabapentin (NEURONTIN) 300 MG capsule gabapentin 300 mg capsule (Patient not taking: Reported on 12/27/2020)  glucosamine su 2KCl-chondroit 500-400 mg  Tab Take 1 tablet by mouth once daily  HYDROcodone-acetaminophen  (NORCO) 5-325 mg tablet hydrocodone 5 mg-acetaminophen  325 mg tablet (Patient not taking: Reported on 12/27/2020)  ibuprofen (MOTRIN) 800 MG tablet Take by mouth  methocarbamoL (ROBAXIN) 500 MG tablet methocarbamol 500 mg tablet (Patient not taking: Reported on 12/27/2020)  multivitamin capsule Take 1 capsule by mouth once daily  mupirocin (BACTROBAN) 2 % ointment mupirocin 2 % topical ointment  niacin 500 mg CR tablet Take by mouth  REPATHA  SURECLICK 140 mg/mL PnIj  valACYclovir  (VALTREX ) 500 MG tablet  venlafaxine  (EFFEXOR -XR) 150 MG XR capsule  venlafaxine  (EFFEXOR -XR) 150 MG XR capsule venlafaxine  ER 150 mg capsule,extended release 24 hr TAKE 1 CAPSULE BY MOUTH DAILY  zinc gluconate 50 mg tablet Take by mouth   No current facility-administered medications on file prior to visit.   History reviewed. No pertinent family history.   Social History   Tobacco Use  Smoking Status Former  Types: Cigars  Smokeless Tobacco Never    Social History   Socioeconomic History  Marital status: Married  Tobacco Use  Smoking status: Former  Types: Cigars  Smokeless tobacco: Never  Substance and Sexual Activity  Alcohol  use: Not Currently  Drug use: Never   Objective:   Vitals:  BP: (!) 140/78  Pulse: 74  Temp: 36.8 C (98.2 F)  SpO2: 96%  Weight: 100.7 kg (222 lb)  Height: 182.9 cm (6')  PainSc: 0-No pain   Body mass index is 30.11 kg/m.  Physical Exam   GENERAL APPEARANCE Comfortable, no acute issues Development: normal Gross deformities: none  SKIN Rash, lesions, ulcers: none Induration, erythema: none Nodules: In the mid back is a raised 1.5  cm lesion consistent with previous incision and drainage of a sebaceous cyst. It is not fluctuant. It is not erythematous. In the upper back just to the left of midline is a palpable 1.5 cm mass involving the dermis and subcutaneous tissues consistent with a  sebaceous cyst. It is not acutely inflamed.  EYES Conjunctiva and lids: normal Pupils: equal  EARS, NOSE, MOUTH, THROAT External ears: no lesion or deformity External nose: no lesion or deformity Hearing: grossly normal  CHEST/CV Not assessed  ABDOMEN Patient is examined standing and recumbent. There is a moderate rectus diastasis present. This does appear to have 2 components in the mid abdomen and in the epigastrium. However, on palpation, there is not a palpable fascial defect present and no evidence of hernia.  GENITOURINARY/RECTAL Not assessed  MUSCULOSKELETAL Station and gait: normal Digits and nails: no clubbing or cyanosis Muscle strength: grossly normal all extremities Deformity: none  LYMPHATIC Cervical: none palpable Supraclavicular: none palpable  PSYCHIATRIC Oriented to person, place, and time: yes Mood and affect: normal for situation Judgment and insight: appropriate for situation   Assessment and Plan:   Sebaceous cyst Rectus diastasis  Patient presents for evaluation of skin lesions on the back. These represent sebaceous cyst and the patient has had a history of infection with incision and drainage. He now desires definitive surgical excision.  We will plan to excise these under local anesthesia as an outpatient surgical procedure. We discussed the size of the incision. We discussed closure with absorbable suture and use of Dermabond as dressing. We discussed restrictions on his activities after the procedure.  Patient does have a rectus diastasis. We discussed what would be necessary for operative repair. He does not have a hernia. He is not symptomatic. No surgical intervention is required at this time.  Krystal Spinner, MD [facility] practice Office: [phone]

## 2024-03-31 NOTE — Op Note (Signed)
 Operative Note  Pre-operative Diagnosis: Sebaceous cyst, mid upper back and left posterior shoulder  Post-operative Diagnosis: Same  Surgeon:  Krystal Spinner, MD  Assistant: None   Procedure:  1.  Excision sebaceous cyst mid upper back, 2.0 x 1.0 x 1.0 cm, skin and subcutaneous tissue;  2.  Excision sebaceous cyst left posterior shoulder, 1.5 x 1.0 x 1.0 cm, skin and subcutaneous tissue  Anesthesia: Local Marcaine  with epinephrine   Estimated Blood Loss: Minimal  Drains: None         Specimen: Both specimens examined and discarded, consistent with sebaceous cyst  Indications:  Patient has a history of an abscessed sebaceous cyst which underwent incision and drainage in 2022. Patient has now developed a raised firm mass at the site of the previous incision and drainage. He presents today to discuss surgical excision.   Procedure:  The patient was seen in the pre-op holding area. The risks, benefits, complications, treatment options, and expected outcomes were previously discussed with the patient. The patient agreed with the proposed plan and has signed the informed consent form.  The patient was brought to the operating room by the surgical team, identified as Robert Hendrix and the procedure verified. A time out was completed and the above information confirmed.  Patient was positioned in a prone position.  Skin was prepped and draped in usual aseptic fashion.  Beginning with the midline incision in the upper back, the skin was anesthetized with local anesthetic.  Using a #15 blade, an elliptical incision was made carried through the skin and into the subcutaneous tissues so as to encompass the entire cyst.  The entire cyst was excised into the deep subcutaneous tissues.  Hemostasis was achieved with the electrocautery.  Skin edges were reapproximated with interrupted 4-0 Monocryl subcuticular sutures.  Next the lesion on the left posterior shoulder is addressed.  Again the skin is  anesthetized with local anesthetic.  This is infiltrated into the deep subcutaneous tissues.  Using a #15 blade an elliptical incision was made so as to encompass the entire lesion.  Dissection was carried into the deep subcutaneous tissues and the entire cyst is excised.  Hemostasis is achieved with the electrocautery.  Skin edges are reapproximated with interrupted 4-0 Monocryl subcuticular sutures.  Wounds are washed and dried.  Dermabond is applied as dressing.  Patient is transferred to the discharge unit.  Patient tolerated the procedure well.   Krystal Spinner, MD [facility] Office: [phone]

## 2024-03-31 NOTE — Discharge Instructions (Signed)
  [facility] [place] [facility] -- DISCHARGE INSTRUCTIONS  REMINDER:   Carry a list of your medications and allergies with you at all times  Call your pharmacy at least 1 week in advance to refill prescriptions  Do not mix any prescribed pain medicine with alcohol   Do not drive any motor vehicles while taking pain medication  Take medications with food unless otherwise directed  Follow-up appointments (date to return to physician): Please call [phone] to confirm your follow up appointment with your surgeon.  Call your Surgeon if you have:  Temperature greater than 101.0  Persistent nausea and vomiting  Severe uncontrolled pain  Redness, tenderness, or signs of infection (pain, swelling, redness, odor or green/yellow discharge around the site)  Difficulty breathing, headache or visual disturbances  Hives  Persistent dizziness or light-headedness  Any other questions or concerns you may have after discharge  In an emergency, call 911 or go to an Emergency Department at a nearby hospital.  Diet: Begin with liquids, and if they are tolerated, resume your usual diet.  Avoid spicy, greasy or heavy foods.  If you have nausea or vomiting, go back to liquids.  If you cannot keep liquids down, call your doctor.  Avoid alcohol  consumption while on prescription pain medications. Good nutrition promotes healing. Increase fiber and fluids.   ADDITIONAL INSTRUCTIONS: Leave Dermabond in place for 7-10 days; remove when edges loosen May shower May resume normal physical activity when comfortable Tylenol  or Advil as needed for pain - contact office if additional pain med required Ice pack as needed first 24-48 hours  [facility] Office: [phone]

## 2024-04-01 ENCOUNTER — Encounter (HOSPITAL_BASED_OUTPATIENT_CLINIC_OR_DEPARTMENT_OTHER): Payer: Self-pay | Admitting: Surgery

## 2024-04-07 ENCOUNTER — Ambulatory Visit: Admitting: Rehabilitative and Restorative Service Providers"

## 2024-04-07 ENCOUNTER — Encounter: Payer: Self-pay | Admitting: Rehabilitative and Restorative Service Providers"

## 2024-04-07 DIAGNOSIS — R293 Abnormal posture: Secondary | ICD-10-CM

## 2024-04-07 DIAGNOSIS — M542 Cervicalgia: Secondary | ICD-10-CM

## 2024-04-07 NOTE — Therapy (Signed)
 OUTPATIENT PHYSICAL THERAPY TREATMENT   Patient Name: Robert Hendrix MRN: [id number] DOB:[birth year], 68 y.o., male Today's Date: 04/07/2024  END OF SESSION:  PT End of Session - 04/07/24 1540     Visit Number 3    Number of Visits 20    Date for Recertification  05/19/24    Authorization Type [employer] $10 copay, no auth needed.    PT Start Time 1520    PT Stop Time 1558    PT Time Calculation (min) 38 min    Activity Tolerance Patient tolerated treatment well    Behavior During Therapy WFL for tasks assessed/performed            Past Medical History:  Diagnosis Date   Alcohol  abuse 2002   Cancer [facility])    basal cell carcinoma of left chest    Chest pain 2017   Drug abuse (HCC) 2002   Hip pain    Hyperglycemia    Hyperlipidemia    on meds   Low back pain    Major depression    hx of   Nasal fracture 1980   Neck pain    OSA on CPAP    Osteoarthritis of thumb    Plantar fasciitis    Prostatitis    Psoriasis    Sleep apnea    OSA uses CPAP-   Thrombosis 2018   LEFT superficial vein thromobsis   Ulnar neuropathy at elbow of left upper extremity 05/04/2019   numbess of fingers   Past Surgical History:  Procedure Laterality Date   COLONOSCOPY  2018   SA-MAC-suprep)ade)-polyps   EXCISION OF BACK LESION N/A 03/31/2024   Procedure: EXCISION OF SEBACEOUS CYSTS X2 FROM BACK;  Surgeon: Eletha Boas, MD;  Location: [facility];  Service: General;  Laterality: N/A;  EXCISION OF BACK CYSYTS   I & D EXTREMITY N/A 09/21/2017   Procedure: IRRIGATION AND DEBRIDEMENT EXTREMITY;  Surgeon: Barbarann Oneil BROCKS, MD;  Location: [facility];  Service: Orthopedics;  Laterality: N/A;   NOSE SURGERY  1978   Nasal fracture    POLYPECTOMY  2018   polyps   PROSTATE BIOPSY  1990   TENDON REPAIR Left 02/23/2022   Procedure: LEFT ANTERIOR TIBIAL TENDON RECONSTRUCTION;  Surgeon: Harden Jerona GAILS, MD;  Location: [facility];  Service: Orthopedics;  Laterality: Left;   WISDOM TOOTH EXTRACTION   1990   Patient Active Problem List   Diagnosis Date Noted   Sebaceous cyst 03/31/2024   Anterior tibial tendon tear, traumatic 02/23/2022   Weakness of left hand 10/12/2019   Ulnar neuropathy at elbow of left upper extremity 05/04/2019   OSA on CPAP 02/23/2019   Wound of left upper extremity 10/08/2017    PCP: Onita Rush MD  REFERRING PROVIDER: Mavis Purchase, MD  REFERRING DIAG: M54.2 Cervicalgia  THERAPY DIAG:  Cervicalgia  Abnormal posture  Rationale for Evaluation and Treatment: Rehabilitation  ONSET DATE: Several months.   SUBJECTIVE:  SUBJECTIVE STATEMENT:    PERTINENT HISTORY:  Fusion for numbness 2020  PAIN:  NPRS scale: 4/10 upon arrival Rt upper trap.  Pain location: bilateral cervical mid to lower  Pain description: achy, sharp at times.  Aggravating factors: head movement, sleeping position.  Relieving factors: motrin, topical surgical   PRECAUTIONS: None  RED FLAGS: None  WEIGHT BEARING RESTRICTIONS: No  FALLS:  Has patient fallen in last 6 months? No  LIVING ENVIRONMENT: Lives in: House/apartment  OCCUPATION: Transport Planner, has been working.   PLOF: Independent, tennis, biking, snowski trip.  Doesn't do yard work.   PATIENT GOALS: Reduce pain,    OBJECTIVE:    PATIENT SURVEYS: Patient-Specific Activity Scoring Scheme  0 represents "unable to perform." 10 represents "able to perform at prior level. 0 1 2 3 4 5 6 7 8 9  10 (Date and Score)   Activity Eval  03/10/2024  04/07/2024  1. Head turns Lt and Rt  8  8   2. Looking up/down (editied from eval error)  9  10  3. Sleeping  9 10  4.    5.    Score 8.667 9.33   Total score = sum of the activity scores/number of activities Minimum detectable change (90%CI) for average  score = 2 points Minimum detectable change (90%CI) for single activity score = 3 points    COGNITION: 03/10/2024 Overall cognitive status: Within functional limits for tasks assessed  SENSATION: 03/10/2024 [facility]  POSTURE:  03/10/2024 Mild rounded shoulders , FHP  PALPATION: 03/10/2024 Trigger points and tightness bilateral upper traps, more middle and central vs. Lateral.    CERVICAL ROM:   ROM AROM (deg) Eval 03/10/2024 AROM 03/24/2024  Flexion 51 Rt pulling more than L 60  Extension 41 50  Right lateral flexion    Left lateral flexion    Right rotation 60 c bilateral neck symptoms 70  Left rotation 60 c bilateral neck symptoms  62   (Blank rows = not tested)  UPPER EXTREMITY ROM:   ROM Right Eval 03/10/2024 Left Eval 03/10/2024  Shoulder flexion    Shoulder extension    Shoulder abduction    Shoulder adduction    Shoulder extension    Shoulder internal rotation    Shoulder external rotation    Elbow flexion    Elbow extension    Wrist flexion    Wrist extension    Wrist ulnar deviation    Wrist radial deviation    Wrist pronation    Wrist supination     (Blank rows = not tested)  UPPER EXTREMITY MMT:  MMT Right Eval 03/10/2024 Left Eval 03/10/2024  Shoulder flexion 5/5 5/5  Shoulder extension    Shoulder abduction 5/5 5/5  Shoulder adduction    Shoulder extension    Shoulder internal rotation 5/5 5/5  Shoulder external rotation 5/5 5/5  Middle trapezius    Lower trapezius    Elbow flexion    Elbow extension    Wrist flexion    Wrist extension    Wrist ulnar deviation    Wrist radial deviation    Wrist pronation    Wrist supination    Grip strength     (Blank rows = not tested)  SPECIAL TESTS:  03/10/2024 Not tested  FUNCTIONAL TESTS:  03/10/2024 Not tested  TODAY'S TREATMENT:                                                                                                       DATE:  04/07/2024 Therex: UBE fwd/back 4 mins lvl 3.5 with 1 min rest between.  Neuro Re-ed  Blue band rows bilateral c scapular retraction focus.  Blue band GH ext to side bilaterally 2 x 15  Blue band ER walk out 5 sec hold x 10 bilaterally, with towel under arm.   Manual Percussive device to Rt upper trap, levator scap.     Self Care Review of Education verbally on post manual and needling soreness possibility and effective strategy to help address and improve following visit.  Strategies included but not limited to:  heat/ice prn, increased water  intake, use of HEP and general mobility to move muscle soreness out.  Pt voiced understanding.    Trigger Point Dry Needling  Subsequent Treatment: Instructions provided previously at initial dry needling treatment.   Patient Verbal Consent Given: Yes Education Handout Provided: Previously Provided Muscles Treated: Rt upper trap  Treatment Response/Outcome: local twitch response    TODAY'S TREATMENT:                                                                                                       DATE:  03/24/2024 Therex: UBE fwd/back 4 mins lvl 3.5 with 1 min rest between. Detailed active compression options to trigger points in SCM.   Manual Percussive device to Lt and Rt upper trap, levator scap.   Prone cPA mid and upper thoracic g4 mobs  Self Care Education verbally on post manual and needling soreness possibility and effective strategy to help address and improve following visit.  Strategies included but not limited to:  heat/ice prn, increased water  intake, use of HEP and general mobility to move muscle soreness out.  Pt voiced understanding.    Trigger Point Dry Needling  Initial Treatment: Pt instructed on Dry Needling rational, procedures, and possible side effects. Pt instructed to  expect mild to moderate muscle soreness later in the day and/or into the next day.  Pt instructed in methods to reduce muscle soreness. Pt instructed to continue prescribed HEP. Because Dry Needling was performed over or adjacent to a lung field, pt was educated on S/S of pneumothorax and to seek immediate medical attention should they occur.  Patient was educated on signs and symptoms of infection and other risk factors and advised to seek medical attention should they occur.  Patient verbalized understanding of these instructions and education.   Patient Verbal Consent Given: Yes Education Handout Provided: Yes Muscles Treated: Lt, Rt upper  trap Treatment Response/Outcome: local twitch response   TODAY'S TREATMENT:                                                                                                       DATE:  03/10/2024 Therex:    HEP instruction/performance c cues for techniques, handout provided.  Trial set performed of each for comprehension and symptom assessment.  See below for exercise list  Manual Percussive device bilateral upper trap.  Cues for home use options during use in clinic.  Discussed techniques.   PATIENT EDUCATION:  03/10/2024 Education details: HEP, POC Person educated: Patient Education method: Programmer, Multimedia, Demonstration, Verbal cues, and Handouts Education comprehension: verbalized understanding, returned demonstration, and verbal cues required  HOME EXERCISE PROGRAM: Access Code: [email or web address] URL: [email or web address] Date: 03/10/2024 Prepared by: Ozell Silvan  Exercises - Seated Scapular Retraction  - 2-3 x daily - 7 x weekly - 1 sets - 10 reps - 5 hold - Supine Scapular Retraction  - 2-3 x daily - 7 x weekly - 1 sets - 10 reps - 5 hold - Supine Cervical Retraction with Towel  - 2-3 x daily - 7 x weekly - 1 sets - 10 reps - 5 hold - Cervical Retraction at Wall  - 2-3 x daily - 7 x weekly - 1 sets - 10 reps - 5 hold - Seated  Upper Trapezius Stretch  - 2-3 x daily - 7 x weekly - 1 sets - 5 reps - 15 hold  ASSESSMENT:  CLINICAL IMPRESSION: Repeated needling and manual for upper trap tightness.  Continued emphasis on stretching mobility and after visit soreness recovery.    OBJECTIVE IMPAIRMENTS: decreased activity tolerance, decreased coordination, decreased endurance, decreased mobility, decreased ROM, decreased strength, increased fascial restrictions, impaired perceived functional ability, increased muscle spasms, impaired flexibility, improper body mechanics, postural dysfunction, and pain.   ACTIVITY LIMITATIONS: bending and sleeping  PARTICIPATION LIMITATIONS: cleaning, interpersonal relationship, community activity, and occupation  PERSONAL FACTORS: history of fusion  REHAB POTENTIAL: Good  CLINICAL DECISION MAKING: Stable/uncomplicated  EVALUATION COMPLEXITY: Low   GOALS: Goals reviewed with patient? Yes  SHORT TERM GOALS: (target date for Short term goals are 3 weeks 03/31/2024)  1.Patient will demonstrate independent use of home exercise program to maintain progress from in clinic treatments. Goal status: Met 04/07/2024 in review  LONG TERM GOALS: (target dates for all long term goals are 10 weeks  05/19/2024 )   1. Patient will demonstrate/report pain at worst less than or equal to 2/10 to facilitate minimal limitation in daily activity secondary to pain symptoms. Goal status: on going 04/07/2024   2. Patient will demonstrate independent use of home exercise program to facilitate ability to maintain/progress functional gains from skilled physical therapy services. Goal status: on going 04/07/2024   3. Patient will demonstrate Patient specific functional scale avg > or = 8/10 to indicate reduced disability due to condition.  Goal status: Met 04/07/2024   4.  Patient will demonstrate cervical AROM WFL s symptoms to facilitate usual head movements for daily activity including driving, self  care.  Goal status: on going 04/07/2024     PLAN:  PT FREQUENCY: 1-2x/week  PT DURATION: 10 weeks  Can include 02853- PT Re-evaluation, 97110-Therapeutic exercises, 97530- Therapeutic activity, 97112- Neuromuscular re-education, 97535- Self Care, 97140- Manual therapy, [phone]- Gait training, [phone]- Orthotic Fit/training, [phone]- Canalith repositioning, [id number]- Aquatic Therapy, [phone]- Electrical stimulation (unattended), [id number] Physical performance testing, 97016- Vasopneumatic device, [id number]- Ultrasound, [id number]- Traction (mechanical), [id number]- Ionotophoresis 4mg /ml Dexamethasone ,  [id number] - Needle insertion w/o injection 1 or 2 muscles, 20561 - Needle insertion w/o injection 3 or more muscles.   Patient/Family education, Balance training, Stair training, Taping, Dry Needling, Joint mobilization, Joint manipulation, Spinal manipulation, Spinal mobilization, Scar mobilization, Vestibular training, Visual/preceptual remediation/compensation, DME instructions, Cryotherapy, and Moist heat.  All performed as medically necessary.  All included unless contraindicated  PLAN FOR NEXT SESSION: DN follow up as needed   Ozell Silvan, PT, DPT, OCS, ATC 03/10/24  4:01 PM

## 2024-04-21 ENCOUNTER — Encounter: Payer: Self-pay | Admitting: Rehabilitative and Restorative Service Providers"

## 2024-04-21 ENCOUNTER — Ambulatory Visit: Admitting: Rehabilitative and Restorative Service Providers"

## 2024-04-21 DIAGNOSIS — M542 Cervicalgia: Secondary | ICD-10-CM

## 2024-04-21 DIAGNOSIS — R293 Abnormal posture: Secondary | ICD-10-CM

## 2024-04-21 NOTE — Therapy (Signed)
 OUTPATIENT PHYSICAL THERAPY TREATMENT   Patient Name: Robert Hendrix MRN: [id number] DOB:1955-12-23, 68 y.o., male Today's Date: 04/21/2024  END OF SESSION:  PT End of Session - 04/21/24 1505     Visit Number 4    Number of Visits 20    Date for Recertification  05/19/24    Authorization Type [employer] $10 copay, no auth needed.    PT Start Time 1501    PT Stop Time 1540    PT Time Calculation (min) 39 min    Activity Tolerance Patient tolerated treatment well    Behavior During Therapy WFL for tasks assessed/performed             Past Medical History:  Diagnosis Date   Alcohol  abuse 2002   Cancer [facility])    basal cell carcinoma of left chest    Chest pain 2017   Drug abuse (HCC) 2002   Hip pain    Hyperglycemia    Hyperlipidemia    on meds   Low back pain    Major depression    hx of   Nasal fracture 1980   Neck pain    OSA on CPAP    Osteoarthritis of thumb    Plantar fasciitis    Prostatitis    Psoriasis    Sleep apnea    OSA uses CPAP-   Thrombosis 2018   LEFT superficial vein thromobsis   Ulnar neuropathy at elbow of left upper extremity 05/04/2019   numbess of fingers   Past Surgical History:  Procedure Laterality Date   COLONOSCOPY  2018   SA-MAC-suprep)ade)-polyps   EXCISION OF BACK LESION N/A 03/31/2024   Procedure: EXCISION OF SEBACEOUS CYSTS X2 FROM BACK;  Surgeon: Eletha Boas, MD;  Location: [facility];  Service: General;  Laterality: N/A;  EXCISION OF BACK CYSYTS   I & D EXTREMITY N/A 09/21/2017   Procedure: IRRIGATION AND DEBRIDEMENT EXTREMITY;  Surgeon: Barbarann Oneil BROCKS, MD;  Location: [facility];  Service: Orthopedics;  Laterality: N/A;   NOSE SURGERY  1978   Nasal fracture    POLYPECTOMY  2018   polyps   PROSTATE BIOPSY  1990   TENDON REPAIR Left 02/23/2022   Procedure: LEFT ANTERIOR TIBIAL TENDON RECONSTRUCTION;  Surgeon: Harden Jerona GAILS, MD;  Location: [facility] OR;  Service: Orthopedics;  Laterality: Left;   WISDOM TOOTH EXTRACTION   1990   Patient Active Problem List   Diagnosis Date Noted   Sebaceous cyst 03/31/2024   Anterior tibial tendon tear, traumatic 02/23/2022   Weakness of left hand 10/12/2019   Ulnar neuropathy at elbow of left upper extremity 05/04/2019   OSA on CPAP 02/23/2019   Wound of left upper extremity 10/08/2017    PCP: Onita Rush MD  REFERRING PROVIDER: Mavis Purchase, MD  REFERRING DIAG: M54.2 Cervicalgia  THERAPY DIAG:  Cervicalgia  Abnormal posture  Rationale for Evaluation and Treatment: Rehabilitation  ONSET DATE: Several months.   SUBJECTIVE:  SUBJECTIVE STATEMENT: Pt indicated dull ache feeling today with turning head quick in car.     PERTINENT HISTORY:  Fusion for numbness 2020  PAIN:  NPRS scale: at worst 7/10 in last 2-3 days.  Pain location: bilateral cervical mid to lower  Pain description: achy, sharp at times.  Aggravating factors: head movement, sleeping position.  Relieving factors: motrin, topical surgical   PRECAUTIONS: None  RED FLAGS: None  WEIGHT BEARING RESTRICTIONS: No  FALLS:  Has patient fallen in last 6 months? No  LIVING ENVIRONMENT: Lives in: House/apartment  OCCUPATION: Transport Planner, has been working.   PLOF: Independent, tennis, biking, snowski trip.  Doesn't do yard work.   PATIENT GOALS: Reduce pain,    OBJECTIVE:    PATIENT SURVEYS: Patient-Specific Activity Scoring Scheme  0 represents "unable to perform." 10 represents "able to perform at prior level. 0 1 2 3 4 5 6 7 8 9  10 (Date and Score)   Activity Eval  03/10/2024  04/07/2024  1. Head turns Lt and Rt  8  8   2. Looking up/down (editied from eval error)  9  10  3. Sleeping  9 10  4.    5.    Score 8.667 9.33   Total score = sum of the activity  scores/number of activities Minimum detectable change (90%CI) for average score = 2 points Minimum detectable change (90%CI) for single activity score = 3 points    COGNITION: 03/10/2024 Overall cognitive status: Within functional limits for tasks assessed  SENSATION: 03/10/2024 [facility]  POSTURE:  03/10/2024 Mild rounded shoulders , FHP  PALPATION: 03/10/2024 Trigger points and tightness bilateral upper traps, more middle and central vs. Lateral.    CERVICAL ROM:   ROM AROM (deg) Eval 03/10/2024 AROM 03/24/2024 AROM 04/21/2024  Flexion 51 Rt pulling more than L 60 60 (65 post manual)  Extension 41 50 50 (51 post manual)  Right lateral flexion     Left lateral flexion     Right rotation 60 c bilateral neck symptoms 70 56 (65 post manual)  Left rotation 60 c bilateral neck symptoms  62 62 (75 post manual)   (Blank rows = not tested)  UPPER EXTREMITY ROM:   ROM Right Eval 03/10/2024 Left Eval 03/10/2024  Shoulder flexion    Shoulder extension    Shoulder abduction    Shoulder adduction    Shoulder extension    Shoulder internal rotation    Shoulder external rotation    Elbow flexion    Elbow extension    Wrist flexion    Wrist extension    Wrist ulnar deviation    Wrist radial deviation    Wrist pronation    Wrist supination     (Blank rows = not tested)  UPPER EXTREMITY MMT:  MMT Right Eval 03/10/2024 Left Eval 03/10/2024  Shoulder flexion 5/5 5/5  Shoulder extension    Shoulder abduction 5/5 5/5  Shoulder adduction    Shoulder extension    Shoulder internal rotation 5/5 5/5  Shoulder external rotation 5/5 5/5  Middle trapezius    Lower trapezius    Elbow flexion    Elbow extension    Wrist flexion    Wrist extension    Wrist ulnar deviation    Wrist radial deviation    Wrist pronation    Wrist supination    Grip strength     (Blank rows = not tested)  SPECIAL TESTS:  03/10/2024 Not tested  FUNCTIONAL TESTS:  03/10/2024 Not  tested                                                                                                                                                                                 TODAY'S TREATMENT:                                                                                                       DATE:  04/21/2024 Therex: Seated towel assist cervical rotation SNAG x 10 bilateral with 5 sec hold  Cues for HEP updates.  New handout given   Neuro Re-ed (muscle activation, scapular control) Prone scapular retraction 5 sec hold x 10 bilaterally Prone scapular retraction with GH ext 5 sec hold x 10 bilaterally  Prone scapular retraction with horizontal abduction bilateral 5 sec hold x 10  Blue band rows bilateral c scapular retraction focus. 2 x 15 Blue band GH ext to side bilaterally 2 x 15   Manual Percussive device to Rt upper trap, levator scap.      TODAY'S TREATMENT:                                                                                                       DATE:  04/07/2024 Therex: UBE fwd/back 4 mins lvl 3.5 with 1 min rest between.  Neuro Re-ed  Blue band rows bilateral c scapular retraction focus.  Blue band GH ext to side bilaterally 2 x 15  Blue band ER walk out 5 sec hold x 10 bilaterally, with towel under arm.   Manual Percussive device to Rt upper trap, levator scap.     Self Care Review of Education verbally on post manual and needling soreness possibility and effective strategy to help address and improve following visit.  Strategies included but not limited to:  heat/ice prn, increased water  intake, use of HEP and  general mobility to move muscle soreness out.  Pt voiced understanding.    Trigger Point Dry Needling  Subsequent Treatment: Instructions provided previously at initial dry needling treatment.   Patient Verbal Consent Given: Yes Education Handout Provided: Previously Provided Muscles Treated: Rt upper trap  Treatment  Response/Outcome: local twitch response    TODAY'S TREATMENT:                                                                                                       DATE:  03/24/2024 Therex: UBE fwd/back 4 mins lvl 3.5 with 1 min rest between. Detailed active compression options to trigger points in SCM.   Manual Percussive device to Lt and Rt upper trap, levator scap.   Prone cPA mid and upper thoracic g4 mobs  Self Care Education verbally on post manual and needling soreness possibility and effective strategy to help address and improve following visit.  Strategies included but not limited to:  heat/ice prn, increased water  intake, use of HEP and general mobility to move muscle soreness out.  Pt voiced understanding.    Trigger Point Dry Needling  Initial Treatment: Pt instructed on Dry Needling rational, procedures, and possible side effects. Pt instructed to expect mild to moderate muscle soreness later in the day and/or into the next day.  Pt instructed in methods to reduce muscle soreness. Pt instructed to continue prescribed HEP. Because Dry Needling was performed over or adjacent to a lung field, pt was educated on S/S of pneumothorax and to seek immediate medical attention should they occur.  Patient was educated on signs and symptoms of infection and other risk factors and advised to seek medical attention should they occur.  Patient verbalized understanding of these instructions and education.   Patient Verbal Consent Given: Yes Education Handout Provided: Yes Muscles Treated: Lt, Rt upper trap Treatment Response/Outcome: local twitch response  PATIENT EDUCATION:  04/21/2024 Education details: HEP update Person educated: Patient Education method: Programmer, Multimedia, Demonstration, Verbal cues, and Handouts Education comprehension: verbalized understanding, returned demonstration, and verbal cues required  HOME EXERCISE PROGRAM: Access Code: [email or web address] URL:  [email or web address] Date: 04/21/2024 Prepared by: Ozell Silvan  Exercises - Seated Scapular Retraction  - 2-3 x daily - 7 x weekly - 1 sets - 10 reps - 5 hold - Supine Cervical Retraction with Towel  - 2-3 x daily - 7 x weekly - 1 sets - 10 reps - 5 hold - Cervical Retraction at Wall  - 2-3 x daily - 7 x weekly - 1 sets - 10 reps - 5 hold - Seated Upper Trapezius Stretch  - 2-3 x daily - 7 x weekly - 1 sets - 5 reps - 15 hold - Seated Assisted Cervical Rotation with Towel  - 2-3 x daily - 7 x weekly - 1 sets - 10 reps - 2-3 hold - Prone Scapular Retraction  - 1 x daily - 7 x weekly - 1 sets - 10 reps - 5 hold - Prone Scapular Slide with Shoulder Extension  - 1 x daily - 7 x weekly - 1 sets -  10 reps - 5 hold - Prone Scapular Retraction Arms at Side  - 1 x daily - 7 x weekly - 1 sets - 10 reps - 5 hold  ASSESSMENT:  CLINICAL IMPRESSION: Continued tightness noted in cervical range assessment.  Manual intevention again helped improve mobility as measured in clinic.  Progressed HEP to challenge range and scapular control.    OBJECTIVE IMPAIRMENTS: decreased activity tolerance, decreased coordination, decreased endurance, decreased mobility, decreased ROM, decreased strength, increased fascial restrictions, impaired perceived functional ability, increased muscle spasms, impaired flexibility, improper body mechanics, postural dysfunction, and pain.   ACTIVITY LIMITATIONS: bending and sleeping  PARTICIPATION LIMITATIONS: cleaning, interpersonal relationship, community activity, and occupation  PERSONAL FACTORS: history of fusion  REHAB POTENTIAL: Good  CLINICAL DECISION MAKING: Stable/uncomplicated  EVALUATION COMPLEXITY: Low   GOALS: Goals reviewed with patient? Yes  SHORT TERM GOALS: (target date for Short term goals are 3 weeks 03/31/2024)  1.Patient will demonstrate independent use of home exercise program to maintain progress from in clinic treatments. Goal  status: Met 04/07/2024 in review  LONG TERM GOALS: (target dates for all long term goals are 10 weeks  05/19/2024 )   1. Patient will demonstrate/report pain at worst less than or equal to 2/10 to facilitate minimal limitation in daily activity secondary to pain symptoms. Goal status: on going 04/07/2024   2. Patient will demonstrate independent use of home exercise program to facilitate ability to maintain/progress functional gains from skilled physical therapy services. Goal status: on going 04/07/2024   3. Patient will demonstrate Patient specific functional scale avg > or = 8/10 to indicate reduced disability due to condition.  Goal status: Met 04/07/2024   4.  Patient will demonstrate cervical AROM WFL s symptoms to facilitate usual head movements for daily activity including driving, self care.   Goal status: on going 04/07/2024     PLAN:  PT FREQUENCY: 1-2x/week  PT DURATION: 10 weeks  Can include 02853- PT Re-evaluation, 97110-Therapeutic exercises, 97530- Therapeutic activity, 97112- Neuromuscular re-education, 97535- Self Care, 97140- Manual therapy, [phone]- Gait training, [phone]- Orthotic Fit/training, [phone]- Canalith repositioning, [id number]- Aquatic Therapy, [phone]- Electrical stimulation (unattended), [id number] Physical performance testing, 97016- Vasopneumatic device, [id number]- Ultrasound, [id number]- Traction (mechanical), [id number]- Ionotophoresis 4mg /ml Dexamethasone ,  [id number] - Needle insertion w/o injection 1 or 2 muscles, 20561 - Needle insertion w/o injection 3 or more muscles.   Patient/Family education, Balance training, Stair training, Taping, Dry Needling, Joint mobilization, Joint manipulation, Spinal manipulation, Spinal mobilization, Scar mobilization, Vestibular training, Visual/preceptual remediation/compensation, DME instructions, Cryotherapy, and Moist heat.  All performed as medically necessary.  All included unless contraindicated  PLAN FOR NEXT SESSION: DN follow up as needed.   Continue to address cervical range.    Ozell Silvan, PT, DPT, OCS, ATC 03/10/24  4:01 PM

## 2024-05-05 ENCOUNTER — Encounter: Admitting: Rehabilitative and Restorative Service Providers"
